# Patient Record
Sex: Male | Born: 1950 | Race: White | Hispanic: No | Marital: Married | Smoking: Current some day smoker
Health system: Southern US, Community
[De-identification: ages and names within clinical notes are randomized; demographics above are authoritative.]

## PROBLEM LIST (undated history)

## (undated) DIAGNOSIS — K2100 Gastro-esophageal reflux disease with esophagitis, without bleeding: Secondary | ICD-10-CM

## (undated) DIAGNOSIS — M4120 Other idiopathic scoliosis, site unspecified: Secondary | ICD-10-CM

## (undated) DIAGNOSIS — M1990 Unspecified osteoarthritis, unspecified site: Secondary | ICD-10-CM

## (undated) DIAGNOSIS — Z8719 Personal history of other diseases of the digestive system: Secondary | ICD-10-CM

## (undated) DIAGNOSIS — E119 Type 2 diabetes mellitus without complications: Secondary | ICD-10-CM

## (undated) DIAGNOSIS — K279 Peptic ulcer, site unspecified, unspecified as acute or chronic, without hemorrhage or perforation: Secondary | ICD-10-CM

## (undated) DIAGNOSIS — K219 Gastro-esophageal reflux disease without esophagitis: Secondary | ICD-10-CM

## (undated) HISTORY — PX: ROTATOR CUFF REPAIR: SHX139

## (undated) HISTORY — DX: Other idiopathic scoliosis, site unspecified: M41.20

## (undated) HISTORY — PX: CHOLECYSTECTOMY: SHX55

---

## 1985-10-10 HISTORY — PX: MENISCUS REPAIR: SHX5179

## 2014-05-10 HISTORY — PX: HERNIA REPAIR: SHX51

## 2017-02-22 ENCOUNTER — Other Ambulatory Visit: Payer: Self-pay | Admitting: Neurosurgery

## 2017-03-10 ENCOUNTER — Encounter: Payer: Self-pay | Admitting: *Deleted

## 2017-03-13 ENCOUNTER — Ambulatory Visit
Admission: RE | Admit: 2017-03-13 | Discharge: 2017-03-13 | Disposition: A | Discharge status: Home / Self Care | Payer: Medicare Other | Source: Ambulatory Visit | Attending: Unknown Physician Specialty | Admitting: Unknown Physician Specialty

## 2017-03-13 ENCOUNTER — Ambulatory Visit: Payer: Medicare Other | Admitting: *Deleted

## 2017-03-13 ENCOUNTER — Encounter
Admission: RE | Disposition: A | Discharge status: Home / Self Care | Payer: Self-pay | Source: Ambulatory Visit | Attending: Unknown Physician Specialty

## 2017-03-13 ENCOUNTER — Encounter: Payer: Self-pay | Admitting: *Deleted

## 2017-03-13 DIAGNOSIS — D120 Benign neoplasm of cecum: Secondary | ICD-10-CM | POA: Diagnosis not present

## 2017-03-13 DIAGNOSIS — K621 Rectal polyp: Secondary | ICD-10-CM | POA: Diagnosis not present

## 2017-03-13 DIAGNOSIS — E119 Type 2 diabetes mellitus without complications: Secondary | ICD-10-CM | POA: Diagnosis not present

## 2017-03-13 DIAGNOSIS — K210 Gastro-esophageal reflux disease with esophagitis: Secondary | ICD-10-CM | POA: Insufficient documentation

## 2017-03-13 DIAGNOSIS — Z7984 Long term (current) use of oral hypoglycemic drugs: Secondary | ICD-10-CM | POA: Insufficient documentation

## 2017-03-13 DIAGNOSIS — F17200 Nicotine dependence, unspecified, uncomplicated: Secondary | ICD-10-CM | POA: Diagnosis not present

## 2017-03-13 DIAGNOSIS — Z9049 Acquired absence of other specified parts of digestive tract: Secondary | ICD-10-CM | POA: Insufficient documentation

## 2017-03-13 DIAGNOSIS — Z1211 Encounter for screening for malignant neoplasm of colon: Secondary | ICD-10-CM | POA: Insufficient documentation

## 2017-03-13 DIAGNOSIS — Z86010 Personal history of colonic polyps: Secondary | ICD-10-CM | POA: Diagnosis not present

## 2017-03-13 DIAGNOSIS — Z79899 Other long term (current) drug therapy: Secondary | ICD-10-CM | POA: Insufficient documentation

## 2017-03-13 DIAGNOSIS — Z8711 Personal history of peptic ulcer disease: Secondary | ICD-10-CM | POA: Insufficient documentation

## 2017-03-13 HISTORY — DX: Gastro-esophageal reflux disease without esophagitis: K21.9

## 2017-03-13 HISTORY — DX: Gastro-esophageal reflux disease with esophagitis, without bleeding: K21.00

## 2017-03-13 HISTORY — DX: Gastro-esophageal reflux disease with esophagitis: K21.0

## 2017-03-13 HISTORY — DX: Peptic ulcer, site unspecified, unspecified as acute or chronic, without hemorrhage or perforation: K27.9

## 2017-03-13 HISTORY — PX: COLONOSCOPY WITH PROPOFOL: SHX5780

## 2017-03-13 HISTORY — DX: Type 2 diabetes mellitus without complications: E11.9

## 2017-03-13 SURGERY — COLONOSCOPY WITH PROPOFOL
Anesthesia: General

## 2017-03-13 MED ORDER — PROPOFOL 10 MG/ML IV BOLUS
INTRAVENOUS | Status: AC
  Filled 2017-03-13: qty 20

## 2017-03-13 MED ORDER — SODIUM CHLORIDE 0.9 % IV SOLN: INTRAVENOUS | Status: DC

## 2017-03-13 MED ORDER — PROPOFOL 10 MG/ML IV BOLUS
INTRAVENOUS | Status: DC | PRN
  Administered 2017-03-13: 350 mg via INTRAVENOUS

## 2017-03-13 MED ORDER — SODIUM CHLORIDE 0.9 % IV SOLN
INTRAVENOUS | Status: DC
  Administered 2017-03-13: 13:00:00 via INTRAVENOUS
  Administered 2017-03-13: 1000 mL via INTRAVENOUS

## 2017-03-13 MED ORDER — PROPOFOL 500 MG/50ML IV EMUL
INTRAVENOUS | Status: AC
  Filled 2017-03-13: qty 50

## 2017-03-13 NOTE — Anesthesia Postprocedure Evaluation (Signed)
Anesthesia Post Note  Patient: Gabriel Cummings  Procedure(s) Performed: Procedure(s) (LRB): COLONOSCOPY WITH PROPOFOL (N/A)  Patient location during evaluation: Endoscopy Anesthesia Type: General Level of consciousness: awake and alert Pain management: pain level controlled Vital Signs Assessment: post-procedure vital signs reviewed and stable Respiratory status: spontaneous breathing and respiratory function stable Cardiovascular status: stable Anesthetic complications: no     Last Vitals:  Vitals:   03/13/17 1414 03/13/17 1424  BP: (!) 133/93 (!) 136/92  Pulse: 73 69  Resp: 20 17  Temp:      Last Pain:  Vitals:   03/13/17 1404  TempSrc: Axillary                 KEPHART,WILLIAM K

## 2017-03-13 NOTE — Anesthesia Post-op Follow-up Note (Cosign Needed)
Anesthesia QCDR form completed.        

## 2017-03-13 NOTE — Op Note (Signed)
[facility] Patient Name: Gabriel Cummings Procedure Date: 03/13/2017 1:31 PM MRN: [id number] Account #: [id number] Date of Birth: [birth year] Admit Type: Outpatient Age: 66 Room: [facility] ENDO ROOM 1 Gender: Male Note Status: Finalized Procedure:            Colonoscopy Indications:          High risk colon cancer surveillance: Personal history                        of colonic polyps Providers:            Manya Silvas, MD Referring MD:         Renee Rival (Referring MD) Medicines:            Propofol per Anesthesia Complications:        No immediate complications. Procedure:            Pre-Anesthesia Assessment:                       - After reviewing the risks and benefits, the patient                        was deemed in satisfactory condition to undergo the                        procedure.                       After obtaining informed consent, the colonoscope was                        passed under direct vision. Throughout the procedure,                        the patient's blood pressure, pulse, and oxygen                        saturations were monitored continuously. The                        Colonoscope was introduced through the anus and                        advanced to the the cecum, identified by appendiceal                        orifice and ileocecal valve. The colonoscopy was                        performed without difficulty. The patient tolerated the                        procedure well. The quality of the bowel preparation                        was good. Findings:      A 10 mm linear polyp was found in the cecum. The polyp was sessile. The       polyp was removed in pieces with a Duck[place] and a regular hot snare and       a cold bipsy.Marland Kitchen Resection and retrieval were complete.  A diminutive polyp was found in the rectum. The polyp was sessile. The       polyp was removed with a jumbo cold forceps. Resection and  retrieval       were complete.      Internal hemorrhoids were found during endoscopy. The hemorrhoids were       medium-sized and Grade II (internal hemorrhoids that prolapse but reduce       spontaneously).      The exam was otherwise without abnormality. Impression:           - One 10 mm polyp in the cecum, removed with a hot                        snare. Resected and retrieved.                       - One diminutive polyp in the rectum, removed with a                        jumbo cold forceps. Resected and retrieved.                       - Internal hemorrhoids.                       - The examination was otherwise normal. Recommendation:       - Await pathology results. Manya Silvas, MD 03/13/2017 2:06:40 PM This report has been signed electronically. Number of Addenda: 0 Note Initiated On: 03/13/2017 1:31 PM Scope Withdrawal Time: 0 hours 19 minutes 25 seconds  Total Procedure Duration: 0 hours 23 minutes 56 seconds       [facility]

## 2017-03-13 NOTE — H&P (Signed)
   Primary Care Physician:  Renee Rival, NP Primary Gastroenterologist:  Dr. Vira Agar  Pre-Procedure History & Physical: HPI:  Gabriel Cummings is a 66 y.o. male is here for an colonoscopy.   Past Medical History:  Diagnosis Date  . Diabetes mellitus without complication ([place])   . GERD (gastroesophageal reflux disease)   . PUD (peptic ulcer disease)   . Reflux esophagitis     Past Surgical History:  Procedure Laterality Date  . CHOLECYSTECTOMY    . HERNIA REPAIR  05/2014  . ROTATOR CUFF REPAIR      Prior to Admission medications   Medication Sig Start Date End Date Taking? Authorizing Provider  fexofenadine (ALLEGRA) 180 MG tablet Take 180 mg by mouth daily.   Yes [provider]  gabapentin (NEURONTIN) 100 MG capsule Take 100 mg by mouth 2 (two) times daily. As needed   Yes [provider]  omeprazole (PRILOSEC) 20 MG capsule Take 20 mg by mouth daily.   Yes [provider]  albuterol (PROVENTIL HFA;VENTOLIN HFA) 108 (90 Base) MCG/ACT inhaler Inhale 2 puffs into the lungs every 6 (six) hours as needed for wheezing or shortness of breath.    [provider]  metFORMIN (GLUCOPHAGE) 500 MG tablet Take 500 mg by mouth daily with supper.    [provider]    Allergies as of 01/02/2017  . (Not on File)    History reviewed. No pertinent family history.  Social History   Social History  . Marital status: Married    Spouse name: N/A  . Number of children: N/A  . Years of education: N/A   Occupational History  . Not on file.   Social History Main Topics  . Smoking status: Current Some Day Smoker  . Smokeless tobacco: Former Systems developer  . Alcohol use Yes  . Drug use: No  . Sexual activity: Not on file   Other Topics Concern  . Not on file   Social History Narrative  . No narrative on file    Review of Systems: See HPI, otherwise negative ROS  Physical Exam: BP 133/86   Temp 97.1 F (36.2 C) (Tympanic)   Resp 18    Ht 5\' 11"  (1.803 m)   Wt 93 kg (205 lb)   SpO2 100%   BMI 28.59 kg/m  General:   Alert,  pleasant and cooperative in NAD Head:  Normocephalic and atraumatic. Neck:  Supple; no masses or thyromegaly. Lungs:  Clear throughout to auscultation.    Heart:  Regular rate and rhythm. Abdomen:  Soft, nontender and nondistended. Normal bowel sounds, without guarding, and without rebound.   Neurologic:  Alert and  oriented x4;  grossly normal neurologically.  Impression/Plan: Gabriel Cummings is here for an colonoscopy to be performed for personal history of colon polpyps  Risks, benefits, limitations, and alternatives regarding  colonoscopy have been reviewed with the patient.  Questions have been answered.  All parties agreeable.   Gaylyn Cheers, MD  03/13/2017, 1:31 PM

## 2017-03-13 NOTE — Anesthesia Preprocedure Evaluation (Signed)
Anesthesia Evaluation  Patient identified by MRN, date of birth, ID band Patient awake    Reviewed: Allergy & Precautions, NPO status , Patient's Chart, lab work & pertinent test results  History of Anesthesia Complications (+) PONV and history of anesthetic complications  Airway Mallampati: I       Dental  (+) Upper Dentures, Partial Lower   Pulmonary Current Smoker,           Cardiovascular negative cardio ROS       Neuro/Psych negative neurological ROS     GI/Hepatic Neg liver ROS, PUD, GERD  Medicated and Controlled,  Endo/Other  diabetes (borderline, no meds), Type 2  Renal/GU negative Renal ROS     Musculoskeletal   Abdominal   Peds  Hematology   Anesthesia Other Findings   Reproductive/Obstetrics                             Anesthesia Physical Anesthesia Plan  ASA: II  Anesthesia Plan: General   Post-op Pain Management:    Induction: Intravenous  Airway Management Planned: Nasal Cannula  Additional Equipment:   Intra-op Plan:   Post-operative Plan:   Informed Consent: I have reviewed the patients History and Physical, chart, labs and discussed the procedure including the risks, benefits and alternatives for the proposed anesthesia with the patient or authorized representative who has indicated his/her understanding and acceptance.     Plan Discussed with:   Anesthesia Plan Comments:         Anesthesia Quick Evaluation

## 2017-03-13 NOTE — Transfer of Care (Signed)
Immediate Anesthesia Transfer of Care Note  Patient: Gabriel Cummings  Procedure(s) Performed: Procedure(s): COLONOSCOPY WITH PROPOFOL (N/A)  Patient Location: PACU  Anesthesia Type:General  Level of Consciousness: awake, alert  and oriented  Airway & Oxygen Therapy: Patient Spontanous Breathing and Patient connected to face mask oxygen  Post-op Assessment: Report given to RN and Post -op Vital signs reviewed and stable  Post vital signs: Reviewed and stable  Last Vitals:  Vitals:   03/13/17 1259 03/13/17 1404  BP: 133/86 119/75  Resp: 18   Temp: 36.2 C (!) 36.1 C    Last Pain:  Vitals:   03/13/17 1404  TempSrc: Axillary         Complications: No apparent anesthesia complications

## 2017-03-14 ENCOUNTER — Encounter: Payer: Self-pay | Admitting: Unknown Physician Specialty

## 2017-03-15 LAB — SURGICAL PATHOLOGY

## 2017-04-03 ENCOUNTER — Other Ambulatory Visit: Payer: Self-pay

## 2017-04-18 ENCOUNTER — Encounter (HOSPITAL_COMMUNITY)
Admission: RE | Admit: 2017-04-18 | Discharge: 2017-04-18 | Disposition: A | Discharge status: Home / Self Care | Payer: Medicare Other | Source: Ambulatory Visit | Attending: Neurosurgery | Admitting: Neurosurgery

## 2017-04-18 ENCOUNTER — Telehealth: Payer: Self-pay | Admitting: Vascular Surgery

## 2017-04-18 NOTE — Telephone Encounter (Signed)
-----   Message from Denman George, RN sent at 04/03/2017  9:45 AM EDT ----- Regarding: needs office consult with Dr. Donnetta Hutching Please schedule a new patient consult with Dr. Donnetta Hutching prior to ALIF on 05/08/17.  Please remind the pt. to bring copy of L-S spine film to appt.

## 2017-04-18 NOTE — Telephone Encounter (Signed)
Sched appt 04/25/17 at 4:15 as per TFE. Lm on hm#.

## 2017-04-21 ENCOUNTER — Encounter: Payer: Self-pay | Admitting: Vascular Surgery

## 2017-04-25 ENCOUNTER — Encounter: Payer: Self-pay | Admitting: Vascular Surgery

## 2017-04-25 ENCOUNTER — Ambulatory Visit (INDEPENDENT_AMBULATORY_CARE_PROVIDER_SITE_OTHER): Payer: Medicare Other | Admitting: Vascular Surgery

## 2017-04-25 VITALS — BP 127/88 | HR 64 | Temp 97.3°F | Resp 18 | Ht 71.0 in | Wt 207.0 lb

## 2017-04-25 DIAGNOSIS — M5137 Other intervertebral disc degeneration, lumbosacral region: Secondary | ICD-10-CM

## 2017-04-25 NOTE — Progress Notes (Signed)
[facility] [place]  Patient name: Gabriel Cummings MRN: [id number] DOB: 10-15-1950 Sex: male  REASON FOR CONSULT: Discuss upcoming anterior exposure for L5-S1 fusion with Dr. Vertell Limber  HPI: Gabriel Cummings is a 66 y.o. male, who is here today for discussion of my role for anterior exposure for his upcoming spine surgery on 05/08/2017. 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 He has failed conservative treatment and is having difficulty with his daily activity. He reports that he has pain that radiates into both lower extremity is in addition to the pain that he has in his back. He reports that some days are more tolerable than others but he generally has a chronic pain. Preoperative workup suggested the need for surgical repair and he is been recommended to have part of his fusion and repair from the anterior approach. His only prior intra-abdominal surgery was cholecystectomy years ago with a  subcostal incision.  Past Medical History:  Diagnosis Date  . Diabetes mellitus without complication ([place])   . GERD (gastroesophageal reflux disease)   . PUD (peptic ulcer disease)   . Reflux esophagitis   . Scoliosis (and kyphoscoliosis), idiopathic     No family history on file.  SOCIAL HISTORY: Social History   Social History  . Marital status: Married    Spouse name: N/A  . Number of children: N/A  . Years of education: N/A   Occupational History  . Not on file.   Social History Main Topics  . Smoking status: Current Some Day Smoker  . Smokeless tobacco: Former Systems developer  . Alcohol use Yes  . Drug use: No  . Sexual activity: Not on file   Other Topics Concern  . Not on file   Social History Narrative  . No narrative on file    No Known Allergies  Current Outpatient Prescriptions  Medication Sig Dispense Refill  . gabapentin (NEURONTIN) 300 MG capsule Take 300 mg by mouth 3 (three) times daily as needed  (pain).    Marland Kitchen omeprazole (PRILOSEC) 20 MG capsule Take 20 mg by mouth daily.    . Naproxen Sodium (ALEVE) 220 MG CAPS Take by mouth.    . naproxen sodium (ALEVE) 220 MG tablet Take 220 mg by mouth 2 (two) times daily with a meal.     No current facility-administered medications for this visit.     REVIEW OF SYSTEMS:  [X]  denotes positive finding, [ ]  denotes negative finding Cardiac  Comments:  Chest pain or chest pressure:    Shortness of breath upon exertion:    Short of breath when lying flat:    Irregular heart rhythm:        Vascular    Pain in calf, thigh, or hip brought on by ambulation:    Pain in feet at night that wakes you up from your sleep:     Blood clot in your veins:    Leg swelling:         Pulmonary    Oxygen at home:    Productive cough:     Wheezing:         Neurologic    Sudden weakness in arms or legs:     Sudden numbness in arms or legs:     Sudden onset of difficulty speaking or slurred speech:    Temporary loss of vision in one eye:     Problems with dizziness:         Gastrointestinal  Blood in stool:     Vomited blood:         Genitourinary    Burning when urinating:     Blood in urine:        Psychiatric    Major depression:         Hematologic    Bleeding problems:    Problems with blood clotting too easily:        Skin    Rashes or ulcers:        Constitutional    Fever or chills:      PHYSICAL EXAM: Vitals:   04/25/17 1536  BP: 127/88  Pulse: 64  Resp: 18  Temp: (!) 97.3 F (36.3 C)  SpO2: 97%  Weight: 207 lb (93.9 kg)  Height: 5\' 11"  (1.803 m)    GENERAL: The patient is a well-nourished male, in no acute distress. The vital signs are documented above. CARDIOVASCULAR: Carotid arteries without bruits bilaterally. 2+ radial 2+ femoral and 2+ dorsalis pedis pulses PULMONARY: There is good air exchange  ABDOMEN: Soft and non-tender . Right subcostal incision MUSCULOSKELETAL: There are no major deformities or  cyanosis. NEUROLOGIC: No focal weakness or paresthesias are detected. SKIN: There are no ulcers or rashes noted. PSYCHIATRIC: The patient has a normal affect.  DATA:  Reviewed MRI report but no CT or plain films available.  MEDICAL ISSUES: I discussed my role for exposure. Explained location of his left lower quadrant transverse incision. Explain mobilization of the rectus muscle, intraperitoneal, contents, left ureter and arterial venous structures overlying the spine. Also explained the potential for injury to each of these. I do not feel feel that he is in any prohibitive risk since he has not had pelvic surgery, is not morbidly obese and has no evidence of peripheral arterial disease. Answered all questions and plan for surgery on 05/08/2017 as scheduled   Rosetta Posner, MD [facility] Tel [phone] Pager [phone]

## 2017-04-27 ENCOUNTER — Encounter: Payer: Self-pay | Admitting: Neurosurgery

## 2017-05-04 NOTE — H&P (Signed)
Patient ID:   [id number] Patient: Gabriel Cummings  Date of Birth: March 15, 1951 Visit Type: Office Visit   Date: 02/15/2017 09:45 AM Provider: Marchia Meiers. Vertell Limber MD   This 66 year old male presents for back pain.   History of Present Illness: 1.  back pain  Mr. Lad returns to discuss details of his surgery. His pain improved slightly since his last visit but today (02/13/2017) he reports that he is having a bad day in terms of pain. His left leg pain persists and radiates to his shin and ankle. His pain is mostly in left thigh and left lumbar region.           MEDICATIONS(added, continued or stopped this visit): Started Medication Directions Instruction Stopped   Aleve 220 mg tablet take 1 tablet by oral route  every 12 hours as needed    01/27/2017 gabapentin 300 mg capsule take 1 capsule by oral route 3 times every day     omeprazole 20 mg capsule,delayed release take 1 capsule by oral route  every day 30 minutes to 1 hour before a meal       ALLERGIES: Ingredient Reaction Medication Name Comment  NO KNOWN ALLERGIES     No known allergies.    Vitals Date Temp F BP Pulse Ht In Wt Lb BMI BSA Pain Score  02/15/2017  143/89 70 71 207.2 28.9  6/10      IMPRESSION MRI reveals spondylolisthesis at L5-S1. Nerve compression and pinched nerve at L3-4- I believe this level is causing much of his pain and discomfort.   We discussed patient's recommended surgical procedure: L3-4, L4-5 left XLIF, ALIF L5-S1. percutaneous screws L3-S1.   Assessment/Plan # Detail Type Description   1. Assessment Lumbar radiculopathy (M54.16).       2. Assessment Low back pain, unspecified back pain laterality, with sciatica presence unspecified (M54.5).       3. 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   Plan Orders Aspen Lo Sag Rigid Panel Quick.       4. 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           nurse education  given. scheduled L3-4, L4-5 left XLIF, ALIF L5-S1. percutaneous screws L3-S1. fit for LSO brace.   Orders: Diagnostic Procedures: Assessment Procedure  M54.16 Lumbar Spine- AP/Lat  Miscellaneous: Assessment   M48.062 Aspen Lo Sag Rigid Panel Quick             Provider:  Vertell Limber MD, Marchia Meiers 02/15/2017 11:12 AM  Dictation edited by: Dionne Bucy    CC Providers: Otho Darner  [address] [place] [address]-              Electronically signed by Marchia Meiers. Vertell Limber MD on 02/18/2017 04:08 PM  Patient ID:   [id number] Patient: Gabriel Cummings  Date of Birth: December 08, 1950 Visit Type: Office Visit   Date: 02/08/2017 11:15 AM Provider: Marchia Meiers. Vertell Limber MD   This 67 year old male presents for back pain.  History of Present Illness: 1.  back pain    Patient returns to review his MRI.   Mr. Muegge pain is a 6-7/10 day to day. His left leg and left heel have been giving him the most discomfort. His pain is intermittent but can be severe and debilitating on some days. The pain is interfering with his quality of life and he reports some depression. He reports decreased endurance and struggles to walk 0.5 miles. He reports no relief from  PT.        MEDICATIONS(added, continued or stopped this visit): Started Medication Directions Instruction Stopped   Aleve 220 mg tablet take 1 tablet by oral route  every 12 hours as needed    01/27/2017 gabapentin 300 mg capsule take 1 capsule by oral route 3 times every day     omeprazole 20 mg capsule,delayed release take 1 capsule by oral route  every day 30 minutes to 1 hour before a meal       ALLERGIES: Ingredient Reaction Medication Name Comment  NO KNOWN ALLERGIES     No known allergies. Reviewed, no changes.    Vitals Date Temp F BP Pulse Ht In Wt Lb BMI BSA Pain Score  02/08/2017  129/77 78 71 206 28.73  7/10      IMPRESSION MRI reveals spondylolisthesis. L1-2 normal. L2-3 mild narrowing. L3-4 increased  narrowing. L4-5 narrowing. L5-S1 narrowing worse on right   x-rays reveal arthritis, degeneration and spondylolisthesis. retroverted pelvis. stenosis and mild scoliosis.   I believe the patient requires an operation (probable XLIF/ALIF) however he will follow up for a return visit in 2-3 weeks and we will discuss surgical possibilities in more detail.      Completed Orders (this encounter) Order Details Reason Side Interpretation Result Initial Treatment Date Region  Scoliosis- AP/Lat      02/08/2017 All Levels to All Levels   Assessment/Plan # Detail Type Description   1. 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         Pain Assessment/Treatment Location: back. Onset: 01/09/2014. Duration: varies. Quality: discomforting. Pain Assessment/Treatment follow-up plan of care: Patient is taking medications as prescribed..  probable XLIF ALIF. follow up to go over surgical plan in 2-3 weeks.   Orders: Diagnostic Procedures: Assessment Procedure  M41.20 Scoliosis- AP/Lat             Provider:  Marchia Meiers. Vertell Limber MD  02/08/2017 01:34 PM Dictation edited by: Dionne Bucy    CC Providers: Otho Darner [address] [place] [address]-              Electronically signed by Marchia Meiers. Vertell Limber MD on 02/09/2017 05:20 PM  Patient ID:   [id number] Patient: Gabriel Cummings  Date of Birth: 05-15-1951 Visit Type: Office Visit   Date: 01/09/2017 09:15 AM Provider: Marchia Meiers. Vertell Limber MD   This 66 year old male presents for back pain.  History of Present Illness: 1.  back pain    Mozes Sagar, 67 year old retired male, visits for evaluation of low back and bilateral leg pain.  Patient reports 2 year history of low back and right leg pain, with increased symptoms and new left leg pain since gym workouts 3 weeks ago.  Today, his pain is a 6-7/10 and a 10/10 at its worst.   Physical therapy offered little relief ESI 's x2 years, most  recent 6 weeks ago offered no relief  Gabapentin 300 mg 1-3/day Aleve daily  History:  Ulcer, GERD Surgical history:  Right shoulder 15 years ago, cholecystectomy 1978, ulcer 15 years ago  2016 lumbar MRI on [place].  X-rays on [facility]        PAST MEDICAL/SURGICAL HISTORY   (Detailed)     Family History  (Detailed)   SOCIAL HISTORY  (Detailed) Preferred language is Unknown.   HOME ENVIRONMENT/SAFETY  The Patient has fallen 1 times in the last year.  The fall(s) resulted in injury.  Details: bruises.       MEDICATIONS(added,  continued or stopped this visit):   ALLERGIES:   REVIEW OF SYSTEMS System Neg/Pos Details  Constitutional Negative Chills, fatigue, fever, malaise, night sweats, weight gain and weight loss.  ENMT Negative Ear drainage, hearing loss, nasal drainage, otalgia, sinus pressure and sore throat.  Eyes Negative Eye discharge, eye pain and vision changes.  Respiratory Negative Chronic cough, cough, dyspnea, known TB exposure and wheezing.  Cardio Negative Chest pain, claudication, edema and irregular heartbeat/palpitations.  GI Negative Abdominal pain, blood in stool, change in stool pattern, constipation, decreased appetite, diarrhea, heartburn, nausea and vomiting.  GU Negative Dribbling, dysuria, erectile dysfunction, hematuria, polyuria, slow stream, urinary frequency, urinary incontinence and urinary retention.  Endocrine Negative Cold intolerance, heat intolerance, polydipsia and polyphagia.  Neuro Negative Dizziness, extremity weakness, gait disturbance, headache, memory impairment, numbness in extremity, seizures and tremors.  Psych Negative Anxiety, depression and insomnia.  Integumentary Negative Brittle hair, brittle nails, change in shape/size of mole(s), hair loss, hirsutism, hives, pruritus, rash and skin lesion.  MS Positive Back pain.  Hema/Lymph Negative Easy bleeding, easy bruising and lymphadenopathy.  Allergic/Immuno Negative Contact  allergy, environmental allergies, food allergies and seasonal allergies.  Reproductive Negative Penile discharge and sexual dysfunction.     Vitals Date Temp F BP Pulse Ht In Wt Lb BMI BSA Pain Score  01/09/2017  157/92 72 71 207.8 28.98  7/10     PHYSICAL EXAM General Level of Distress: no acute distress Overall Appearance: normal  Head and Face  Right Left  Fundoscopic Exam:  normal normal    Cardiovascular Cardiac: regular rate and rhythm without murmur  Right Left  Carotid Pulses: normal normal  Respiratory Lungs: clear to auscultation  Neurological Orientation: normal Recent and Remote Memory: normal Attention Span and Concentration:   normal Language: normal Fund of Knowledge: normal  Right Left Sensation: normal normal Upper Extremity Coordination: normal normal  Lower Extremity Coordination: normal normal  Musculoskeletal Gait and Station: normal  Right Left Upper Extremity Muscle Strength: normal normal Lower Extremity Muscle Strength: normal normal Upper Extremity Muscle Tone:  normal normal Lower Extremity Muscle Tone: normal normal   Motor Strength Upper and lower extremity motor strength was tested in the clinically pertinent muscles.     Deep Tendon Reflexes  Right Left Biceps: normal normal Triceps: normal normal Brachioradialis: normal normal Patellar: normal normal Achilles: normal normal  Sensory Sensation was tested at L1 to S1.   Cranial Nerves II. Optic Nerve/Visual Fields: normal III. Oculomotor: normal IV. Trochlear: normal V. Trigeminal: normal VI. Abducens: normal VII. Facial: normal VIII. Acoustic/Vestibular: normal IX. Glossopharyngeal: normal X. Vagus: normal XI. Spinal Accessory: normal XII. Hypoglossal: normal  Motor and other Tests Lhermittes: negative Rhomberg: negative Pronator drift: absent     Right Left Hoffman's: normal normal Clonus: normal normal Babinski: normal normal SLR: positive at 45  degrees positive at 45 degrees Patrick's Corky Sox): negative negative Toe Walk: normal normal Toe Lift: normal normal Heel Walk: normal normal SI Joint: nontender nontender   Additional Findings:  Sciatic notch discomfort bilaterally and pain at lumbosacral junction, 8 inch toe touch, able to stand on toes and heels, EOMM intact, fundoscopic exam reveals no areas of concern, UE & LE strength is full, diminished but symmetric reflexes throughout, mid-calf stocking distribution numbness bilaterally, positive SLR at 40-45 degrees bilaterally.   DIAGNOSTIC RESULTS 10/16/14 MRI:  Multilevel degenerative changes with most significant neuroforaminal narrowing moderate right at L5-S1.  Additional scattered neuroforamina as described.  Tiny right paracentral disc protrusion and extrusion at L2-L3.  No significant compression of the exiting nerve root is suspected at this level however.     IMPRESSION The patient comes in for evaluation of low back and bilateral LE pain, left>right.  His pain travels to his feet bilaterally and into his middle toes.  Recently, his pain has been increasing in severity.  He reports numbness in his LE bilaterally and diabetes.  His blood glucose levels have been 120-150 mg/dL recently with 6.9 a1c.  He is taking gabapentin to treat his symptoms.  His MRI reveals disc protrusion right>left at L2-3, stenosis at L3-4, and spondylolisthesis of L5-S1 with facet joint arthritis on the right at this level.  X-rays reveal lumbar dextroconvex scoliosis and anterolisthesis of L5-S1 with neutral: 9 mm, flexion: 10 mm, extension: 5.5 mm.  On confrontational testing, he has sciatic notch discomfort bilaterally and pain at lumbosacral junction, 8 inch toe touch,able to stand on toes and heels, EOMM intact, fundoscopic exam reveals no areas of concern, UE & LE strength is full, diminished but symmetric reflexes throughout, mid-calf stocking distribution numbness bilaterally, positive SLR at  40-45 degrees bilaterally.  I believe his diabetes may be contributing to some of his symptoms and I would like to order a lumbar MRI to further evaluate his condition.  Follow up with me after to discuss.  Completed Orders (this encounter) Order Details Reason Side Interpretation Result Initial Treatment Date Region  Lumbar Spine- AP/Lat/Obls/Spot/Flex/Ex      01/09/2017 All Levels to All Levels   Assessment/Plan # Detail Type Description   1. Assessment Lumbar radiculopathy (M54.16).         Pain Assessment/Treatment Pain Scale: 7/10. Method: Numeric Pain Intensity Scale. Location: back. Duration: varies. Quality: discomforting. Pain Assessment/Treatment follow-up plan of care: Patient uses inversion machine and stretches..  Fall Risk Plan The Patient has fallen 1 times in the last year. The fall(s) resulted in injury. Details: bruises. Falls risk follow-up plan of care: Assisted devices: Advise to use safety measures when available.  Ordered lumbar MRI.  Follow up with me after to discuss.  Orders: Diagnostic Procedures: Assessment Procedure  M54.16 Lumbar Spine- AP/Lat/Obls/Spot/Flex/Ex             Provider:  Marchia Meiers. Vertell Limber MD  01/09/2017 12:07 PM Dictation edited by: Daine Gravel    CC Providers: Otho Darner [address] [address]-              Electronically signed by Marchia Meiers. Vertell Limber MD on 01/09/2017 01:10 PM

## 2017-05-04 NOTE — Pre-Procedure Instructions (Signed)
Gabriel Cummings  05/04/2017      [place], [place] - [address] [place] [address] Phone: [phone] Fax: [phone]    Your procedure is scheduled on July 30  Report to [facility] at [facility].M.  Call this number if you have problems the morning of surgery:  [phone]   Remember:  Do not eat food or drink liquids after midnight.   Take these medicines the morning of surgery with A SIP OF WATER gabapentin (NEURONTIN), omeprazole (PRILOSEC)  7 days prior to surgery STOP taking any Aspirin, Aleve, Naproxen, Ibuprofen, Motrin, Advil, Goody's, BC's, all herbal medications, fish oil, and all vitamins    Do not wear jewelry  Do not wear lotions, powders, or cologne, or deoderant.  Men may shave face and neck.  Do not bring valuables to the hospital.  [facility] is not responsible for any belongings or valuables.  Contacts, dentures or bridgework may not be worn into surgery.  Leave your suitcase in the car.  After surgery it may be brought to your room.  For patients admitted to the hospital, discharge time will be determined by your treatment team.  Patients discharged the day of surgery will not be allowed to drive home.    Special instructions:   - Preparing For Surgery  Before surgery, you can play an important role. Because skin is not sterile, your skin needs to be as free of germs as possible. You can reduce the number of germs on your skin by washing with CHG (chlorahexidine gluconate) Soap before surgery.  CHG is an antiseptic cleaner which kills germs and bonds with the skin to continue killing germs even after washing.  Please do not use if you have an allergy to CHG or antibacterial soaps. If your skin becomes reddened/irritated stop using the CHG.  Do not shave (including legs and underarms) for at least 48 hours prior to first CHG shower. It is OK to shave your  face.  Please follow these instructions carefully.   1. Shower the NIGHT BEFORE SURGERY and the MORNING OF SURGERY with CHG.   2. If you chose to wash your hair, wash your hair first as usual with your normal shampoo.  3. After you shampoo, rinse your hair and body thoroughly to remove the shampoo.  4. Use CHG as you would any other liquid soap. You can apply CHG directly to the skin and wash gently with a scrungie or a clean washcloth.   5. Apply the CHG Soap to your body ONLY FROM THE NECK DOWN.  Do not use on open wounds or open sores. Avoid contact with your eyes, ears, mouth and genitals (private parts). Wash genitals (private parts) with your normal soap.  6. Wash thoroughly, paying special attention to the area where your surgery will be performed.  7. Thoroughly rinse your body with warm water from the neck down.  8. DO NOT shower/wash with your normal soap after using and rinsing off the CHG Soap.  9. Pat yourself dry with a CLEAN TOWEL.   10. Wear CLEAN PAJAMAS   11. Place CLEAN SHEETS on your bed the night of your first shower and DO NOT SLEEP WITH PETS.    Day of Surgery: Do not apply any deodorants/lotions. Please wear clean clothes to the hospital/surgery center.      Please read over the following fact sheets that you were given.

## 2017-05-05 ENCOUNTER — Encounter (HOSPITAL_COMMUNITY): Payer: Self-pay

## 2017-05-05 ENCOUNTER — Encounter (HOSPITAL_COMMUNITY)
Admission: RE | Admit: 2017-05-05 | Discharge: 2017-05-05 | Disposition: A | Discharge status: Home / Self Care | Payer: Medicare Other | Source: Ambulatory Visit | Attending: Neurosurgery | Admitting: Neurosurgery

## 2017-05-05 DIAGNOSIS — Z0183 Encounter for blood typing: Secondary | ICD-10-CM | POA: Insufficient documentation

## 2017-05-05 DIAGNOSIS — Z01812 Encounter for preprocedural laboratory examination: Secondary | ICD-10-CM

## 2017-05-05 DIAGNOSIS — R001 Bradycardia, unspecified: Secondary | ICD-10-CM

## 2017-05-05 DIAGNOSIS — M48062 Spinal stenosis, lumbar region with neurogenic claudication: Secondary | ICD-10-CM | POA: Insufficient documentation

## 2017-05-05 DIAGNOSIS — Z01818 Encounter for other preprocedural examination: Secondary | ICD-10-CM

## 2017-05-05 DIAGNOSIS — E119 Type 2 diabetes mellitus without complications: Secondary | ICD-10-CM | POA: Insufficient documentation

## 2017-05-05 HISTORY — DX: Personal history of other diseases of the digestive system: Z87.19

## 2017-05-05 HISTORY — DX: Unspecified osteoarthritis, unspecified site: M19.90

## 2017-05-05 LAB — CBC
HEMATOCRIT: 47.8 % (ref 39.0–52.0)
HEMOGLOBIN: 16.3 g/dL (ref 13.0–17.0)
MCH: 31.1 pg (ref 26.0–34.0)
MCHC: 34.1 g/dL (ref 30.0–36.0)
MCV: 91.2 fL (ref 78.0–100.0)
Platelets: 221 10*3/uL (ref 150–400)
RBC: 5.24 MIL/uL (ref 4.22–5.81)
RDW: 12.8 % (ref 11.5–15.5)
WBC: 9.4 10*3/uL (ref 4.0–10.5)

## 2017-05-05 LAB — BASIC METABOLIC PANEL
ANION GAP: 9 (ref 5–15)
BUN: 17 mg/dL (ref 6–20)
CALCIUM: 9.6 mg/dL (ref 8.9–10.3)
CO2: 28 mmol/L (ref 22–32)
Chloride: 100 mmol/L — ABNORMAL LOW (ref 101–111)
Creatinine, Ser: 0.93 mg/dL (ref 0.61–1.24)
GFR calc non Af Amer: >60 mL/min (ref >60)
Glucose, Bld: 128 mg/dL — ABNORMAL HIGH (ref 65–99)
Potassium: 4.2 mmol/L (ref 3.5–5.1)
Sodium: 137 mmol/L (ref 135–145)

## 2017-05-05 LAB — SURGICAL PCR SCREEN
MRSA, PCR: NEGATIVE
Staphylococcus aureus: NEGATIVE

## 2017-05-05 LAB — TYPE AND SCREEN
ABO/RH(D): A POS
ANTIBODY SCREEN: NEGATIVE

## 2017-05-05 LAB — ABO/RH: ABO/RH(D): A POS

## 2017-05-05 MED ORDER — CEFAZOLIN SODIUM-DEXTROSE 2-4 GM/100ML-% IV SOLN
2.0000 g | INTRAVENOUS | Status: AC
  Administered 2017-05-08 (×2): 2 g via INTRAVENOUS
  Filled 2017-05-05: qty 100

## 2017-05-05 NOTE — Progress Notes (Signed)
PCP - Hollice Gong, PA Cardiologist - patient denies  Chest x-ray - n/a EKG - 05/05/2017 Stress Test - patient states it was 20+ years ago ECHO - patient denies Cardiac Cath - patient denies  Sleep Study - patient denies   Fasting Blood Sugar - 110-120s Checks Blood Sugar 1 time a day     Patient denies shortness of breath, fever, cough and chest pain at PAT appointment   Patient verbalized understanding of instructions that were given to them at the PAT appointment. Patient was also instructed that they will need to review over the PAT instructions again at home before surgery.

## 2017-05-06 LAB — HEMOGLOBIN A1C
HEMOGLOBIN A1C: 6.6 % — AB (ref 4.8–5.6)
Mean Plasma Glucose: 143 mg/dL

## 2017-05-07 NOTE — Anesthesia Preprocedure Evaluation (Addendum)
Anesthesia Evaluation  Patient identified by MRN, date of birth, ID band Patient awake    Reviewed: Allergy & Precautions, NPO status , Patient's Chart, lab work & pertinent test results  Airway Mallampati: II  TM Distance: >3 FB Neck ROM: Full    Dental  (+) Dental Advisory Given   Pulmonary Current Smoker,    breath sounds clear to auscultation       Cardiovascular negative cardio ROS   Rhythm:Regular Rate:Normal     Neuro/Psych negative neurological ROS     GI/Hepatic Neg liver ROS, hiatal hernia, PUD, GERD  ,  Endo/Other  diabetes  Renal/GU negative Renal ROS     Musculoskeletal  (+) Arthritis ,   Abdominal   Peds  Hematology negative hematology ROS (+)   Anesthesia Other Findings   Reproductive/Obstetrics                            Lab Results  Component Value Date   WBC 9.4 05/05/2017   HGB 16.3 05/05/2017   HCT 47.8 05/05/2017   MCV 91.2 05/05/2017   PLT 221 05/05/2017   Lab Results  Component Value Date   CREATININE 0.93 05/05/2017   BUN 17 05/05/2017   NA 137 05/05/2017   K 4.2 05/05/2017   CL 100 (L) 05/05/2017   CO2 28 05/05/2017    Anesthesia Physical Anesthesia Plan  ASA: II  Anesthesia Plan: General   Post-op Pain Management:    Induction: Intravenous  PONV Risk Score and Plan: 3 and Ondansetron, Dexamethasone, Midazolam and Treatment may vary due to age or medical condition  Airway Management Planned: Oral ETT  Additional Equipment: Arterial line  Intra-op Plan:   Post-operative Plan: Extubation in OR  Informed Consent: I have reviewed the patients History and Physical, chart, labs and discussed the procedure including the risks, benefits and alternatives for the proposed anesthesia with the patient or authorized representative who has indicated his/her understanding and acceptance.   Dental advisory given  Plan Discussed with:  CRNA  Anesthesia Plan Comments:        Anesthesia Quick Evaluation

## 2017-05-08 ENCOUNTER — Inpatient Hospital Stay (HOSPITAL_COMMUNITY): Payer: Medicare Other | Admitting: Anesthesiology

## 2017-05-08 ENCOUNTER — Inpatient Hospital Stay (HOSPITAL_COMMUNITY)
Admission: RE | Admit: 2017-05-08 | Discharge: 2017-05-09 | DRG: 460 | Disposition: A | Discharge status: Home / Self Care | Payer: Medicare Other | Attending: Neurosurgery | Admitting: Neurosurgery

## 2017-05-08 ENCOUNTER — Inpatient Hospital Stay (HOSPITAL_COMMUNITY): Payer: Medicare Other

## 2017-05-08 ENCOUNTER — Inpatient Hospital Stay (HOSPITAL_COMMUNITY)
Admission: RE | Disposition: A | Discharge status: Home / Self Care | Payer: Self-pay | Source: Home / Self Care | Attending: Neurosurgery

## 2017-05-08 ENCOUNTER — Encounter (HOSPITAL_COMMUNITY): Payer: Self-pay | Admitting: *Deleted

## 2017-05-08 DIAGNOSIS — M4126 Other idiopathic scoliosis, lumbar region: Secondary | ICD-10-CM | POA: Diagnosis present

## 2017-05-08 DIAGNOSIS — M5117 Intervertebral disc disorders with radiculopathy, lumbosacral region: Secondary | ICD-10-CM | POA: Diagnosis present

## 2017-05-08 DIAGNOSIS — M4690 Unspecified inflammatory spondylopathy, site unspecified: Secondary | ICD-10-CM | POA: Diagnosis present

## 2017-05-08 DIAGNOSIS — G588 Other specified mononeuropathies: Secondary | ICD-10-CM | POA: Diagnosis present

## 2017-05-08 DIAGNOSIS — M4316 Spondylolisthesis, lumbar region: Secondary | ICD-10-CM | POA: Diagnosis present

## 2017-05-08 DIAGNOSIS — M4317 Spondylolisthesis, lumbosacral region: Secondary | ICD-10-CM | POA: Diagnosis present

## 2017-05-08 DIAGNOSIS — Z79899 Other long term (current) drug therapy: Secondary | ICD-10-CM

## 2017-05-08 DIAGNOSIS — M48062 Spinal stenosis, lumbar region with neurogenic claudication: Secondary | ICD-10-CM | POA: Diagnosis not present

## 2017-05-08 DIAGNOSIS — Z419 Encounter for procedure for purposes other than remedying health state, unspecified: Secondary | ICD-10-CM

## 2017-05-08 DIAGNOSIS — E1141 Type 2 diabetes mellitus with diabetic mononeuropathy: Secondary | ICD-10-CM | POA: Diagnosis present

## 2017-05-08 DIAGNOSIS — K219 Gastro-esophageal reflux disease without esophagitis: Secondary | ICD-10-CM | POA: Diagnosis present

## 2017-05-08 DIAGNOSIS — M5416 Radiculopathy, lumbar region: Secondary | ICD-10-CM

## 2017-05-08 DIAGNOSIS — M545 Low back pain: Secondary | ICD-10-CM | POA: Diagnosis present

## 2017-05-08 DIAGNOSIS — Z9049 Acquired absence of other specified parts of digestive tract: Secondary | ICD-10-CM

## 2017-05-08 DIAGNOSIS — M419 Scoliosis, unspecified: Secondary | ICD-10-CM | POA: Diagnosis present

## 2017-05-08 DIAGNOSIS — M5116 Intervertebral disc disorders with radiculopathy, lumbar region: Secondary | ICD-10-CM | POA: Diagnosis present

## 2017-05-08 HISTORY — PX: ABDOMINAL EXPOSURE: SHX5708

## 2017-05-08 HISTORY — PX: ANTERIOR LUMBAR FUSION: SHX1170

## 2017-05-08 HISTORY — PX: ANTERIOR LAT LUMBAR FUSION: SHX1168

## 2017-05-08 HISTORY — PX: LUMBAR PERCUTANEOUS PEDICLE SCREW 3 LEVEL: SHX5562

## 2017-05-08 LAB — GLUCOSE, CAPILLARY
GLUCOSE-CAPILLARY: 131 mg/dL — AB (ref 65–99)
Glucose-Capillary: 171 mg/dL — ABNORMAL HIGH (ref 65–99)
Glucose-Capillary: 163 mg/dL — ABNORMAL HIGH (ref 65–99)

## 2017-05-08 SURGERY — ANTERIOR LUMBAR FUSION 1 LEVEL
Anesthesia: General

## 2017-05-08 MED ORDER — ACETAMINOPHEN 650 MG RE SUPP
650.0000 mg | RECTAL | Status: DC | PRN
Start: 2017-05-08 — End: 2017-05-09

## 2017-05-08 MED ORDER — MIDAZOLAM HCL 2 MG/2ML IJ SOLN
INTRAMUSCULAR | Status: AC
  Filled 2017-05-08: qty 2

## 2017-05-08 MED ORDER — FENTANYL CITRATE (PF) 250 MCG/5ML IJ SOLN
INTRAMUSCULAR | Status: AC
  Filled 2017-05-08: qty 5

## 2017-05-08 MED ORDER — KCL IN DEXTROSE-NACL 20-5-0.45 MEQ/L-%-% IV SOLN: INTRAVENOUS | Status: DC

## 2017-05-08 MED ORDER — OXYCODONE HCL 5 MG PO TABS
5.0000 mg | ORAL_TABLET | ORAL | Status: DC | PRN
  Administered 2017-05-08: 5 mg via ORAL
  Administered 2017-05-08 – 2017-05-09 (×4): 10 mg via ORAL
  Filled 2017-05-08 (×6): qty 2

## 2017-05-08 MED ORDER — GABAPENTIN 300 MG PO CAPS: 300.0000 mg | ORAL_CAPSULE | Freq: Three times a day (TID) | ORAL | Status: DC | PRN

## 2017-05-08 MED ORDER — LIDOCAINE-EPINEPHRINE 1 %-1:100000 IJ SOLN
INTRAMUSCULAR | Status: AC
Start: 2017-05-08 — End: 2017-05-08
  Filled 2017-05-08: qty 1

## 2017-05-08 MED ORDER — CHLORHEXIDINE GLUCONATE 4 % EX LIQD: 60.0000 mL | Freq: Once | CUTANEOUS | Status: DC

## 2017-05-08 MED ORDER — MENTHOL 3 MG MT LOZG: 1.0000 | LOZENGE | OROMUCOSAL | Status: DC | PRN

## 2017-05-08 MED ORDER — BUPIVACAINE LIPOSOME 1.3 % IJ SUSP
INTRAMUSCULAR | Status: DC | PRN
  Administered 2017-05-08: 20 mL

## 2017-05-08 MED ORDER — THROMBIN 20000 UNITS EX SOLR
CUTANEOUS | Status: AC
  Filled 2017-05-08: qty 20000

## 2017-05-08 MED ORDER — BISACODYL 10 MG RE SUPP
10.0000 mg | Freq: Every day | RECTAL | Status: DC | PRN
Start: 2017-05-08 — End: 2017-05-09
  Administered 2017-05-09: 10 mg via RECTAL
  Filled 2017-05-08: qty 1

## 2017-05-08 MED ORDER — HYDROMORPHONE HCL 1 MG/ML IJ SOLN
0.2500 mg | INTRAMUSCULAR | Status: DC | PRN
  Administered 2017-05-08 (×4): 0.5 mg via INTRAVENOUS

## 2017-05-08 MED ORDER — SODIUM CHLORIDE 0.9% FLUSH
3.0000 mL | Freq: Two times a day (BID) | INTRAVENOUS | Status: DC
  Administered 2017-05-09: 3 mL via INTRAVENOUS

## 2017-05-08 MED ORDER — ACETAMINOPHEN 325 MG PO TABS
650.0000 mg | ORAL_TABLET | ORAL | Status: DC | PRN
  Administered 2017-05-09: 650 mg via ORAL
  Filled 2017-05-08: qty 2

## 2017-05-08 MED ORDER — PANTOPRAZOLE SODIUM 40 MG PO TBEC
40.0000 mg | DELAYED_RELEASE_TABLET | Freq: Every day | ORAL | Status: DC
  Administered 2017-05-09: 40 mg via ORAL
  Filled 2017-05-08: qty 1

## 2017-05-08 MED ORDER — ONDANSETRON HCL 4 MG/2ML IJ SOLN
INTRAMUSCULAR | Status: DC | PRN
  Administered 2017-05-08: 4 mg via INTRAVENOUS

## 2017-05-08 MED ORDER — HYDROMORPHONE HCL 1 MG/ML IJ SOLN
INTRAMUSCULAR | Status: AC
  Filled 2017-05-08: qty 1

## 2017-05-08 MED ORDER — THROMBIN 5000 UNITS EX SOLR
CUTANEOUS | Status: AC
  Filled 2017-05-08: qty 10000

## 2017-05-08 MED ORDER — CHLORHEXIDINE GLUCONATE CLOTH 2 % EX PADS: 6.0000 | MEDICATED_PAD | Freq: Once | CUTANEOUS | Status: DC

## 2017-05-08 MED ORDER — THROMBIN 20000 UNITS EX SOLR
CUTANEOUS | Status: DC | PRN
  Administered 2017-05-08: 20000 [IU] via TOPICAL

## 2017-05-08 MED ORDER — SODIUM CHLORIDE 0.9 % IV SOLN
INTRAVENOUS | Status: DC | PRN
  Administered 2017-05-08: 13:00:00 via INTRAVENOUS

## 2017-05-08 MED ORDER — BUPIVACAINE LIPOSOME 1.3 % IJ SUSP
20.0000 mL | Freq: Once | INTRAMUSCULAR | Status: DC
  Filled 2017-05-08: qty 20

## 2017-05-08 MED ORDER — SENNOSIDES-DOCUSATE SODIUM 8.6-50 MG PO TABS
1.0000 | ORAL_TABLET | Freq: Every evening | ORAL | Status: DC | PRN
  Administered 2017-05-09: 1 via ORAL
  Filled 2017-05-08: qty 1

## 2017-05-08 MED ORDER — ROCURONIUM BROMIDE 100 MG/10ML IV SOLN
INTRAVENOUS | Status: DC | PRN
  Administered 2017-05-08: 50 mg via INTRAVENOUS
  Administered 2017-05-08: 20 mg via INTRAVENOUS

## 2017-05-08 MED ORDER — FENTANYL CITRATE (PF) 100 MCG/2ML IJ SOLN
INTRAMUSCULAR | Status: DC | PRN
  Administered 2017-05-08 (×15): 50 ug via INTRAVENOUS

## 2017-05-08 MED ORDER — ZOLPIDEM TARTRATE 5 MG PO TABS: 5.0000 mg | ORAL_TABLET | Freq: Every evening | ORAL | Status: DC | PRN

## 2017-05-08 MED ORDER — LIDOCAINE-EPINEPHRINE 1 %-1:100000 IJ SOLN
INTRAMUSCULAR | Status: DC | PRN
  Administered 2017-05-08: 5 mL

## 2017-05-08 MED ORDER — ONDANSETRON HCL 4 MG PO TABS: 4.0000 mg | ORAL_TABLET | Freq: Four times a day (QID) | ORAL | Status: DC | PRN

## 2017-05-08 MED ORDER — ROCURONIUM BROMIDE 10 MG/ML (PF) SYRINGE
PREFILLED_SYRINGE | INTRAVENOUS | Status: AC
  Filled 2017-05-08: qty 5

## 2017-05-08 MED ORDER — PROPOFOL 500 MG/50ML IV EMUL
INTRAVENOUS | Status: DC | PRN
  Administered 2017-05-08: 50 ug/kg/min via INTRAVENOUS

## 2017-05-08 MED ORDER — DEXAMETHASONE SODIUM PHOSPHATE 10 MG/ML IJ SOLN
INTRAMUSCULAR | Status: DC | PRN
  Administered 2017-05-08: 10 mg via INTRAVENOUS

## 2017-05-08 MED ORDER — MIDAZOLAM HCL 5 MG/5ML IJ SOLN
INTRAMUSCULAR | Status: DC | PRN
  Administered 2017-05-08: 2 mg via INTRAVENOUS

## 2017-05-08 MED ORDER — CEFAZOLIN SODIUM-DEXTROSE 2-4 GM/100ML-% IV SOLN
2.0000 g | Freq: Three times a day (TID) | INTRAVENOUS | Status: AC
  Administered 2017-05-08 – 2017-05-09 (×2): 2 g via INTRAVENOUS
  Filled 2017-05-08 (×2): qty 100

## 2017-05-08 MED ORDER — BUPIVACAINE HCL (PF) 0.5 % IJ SOLN
INTRAMUSCULAR | Status: DC | PRN
  Administered 2017-05-08: 5 mL

## 2017-05-08 MED ORDER — PROMETHAZINE HCL 25 MG/ML IJ SOLN: 6.2500 mg | INTRAMUSCULAR | Status: DC | PRN

## 2017-05-08 MED ORDER — LACTATED RINGERS IV SOLN
INTRAVENOUS | Status: DC | PRN
  Administered 2017-05-08 (×2): via INTRAVENOUS

## 2017-05-08 MED ORDER — SODIUM CHLORIDE 0.9% FLUSH: 3.0000 mL | INTRAVENOUS | Status: DC | PRN

## 2017-05-08 MED ORDER — DEXAMETHASONE SODIUM PHOSPHATE 10 MG/ML IJ SOLN
INTRAMUSCULAR | Status: AC
  Filled 2017-05-08: qty 1

## 2017-05-08 MED ORDER — CEFAZOLIN SODIUM 1 G IJ SOLR
INTRAMUSCULAR | Status: AC
  Filled 2017-05-08: qty 20

## 2017-05-08 MED ORDER — DIAZEPAM 5 MG PO TABS
5.0000 mg | ORAL_TABLET | Freq: Four times a day (QID) | ORAL | Status: DC | PRN
  Administered 2017-05-08 – 2017-05-09 (×4): 5 mg via ORAL
  Filled 2017-05-08 (×4): qty 1

## 2017-05-08 MED ORDER — SUCCINYLCHOLINE 20MG/ML (10ML) SYRINGE FOR MEDFUSION PUMP - OPTIME
INTRAMUSCULAR | Status: DC | PRN
  Administered 2017-05-08: 120 mg via INTRAVENOUS

## 2017-05-08 MED ORDER — HEMOSTATIC AGENTS (NO CHARGE) OPTIME
TOPICAL | Status: DC | PRN
  Administered 2017-05-08: 1 via TOPICAL

## 2017-05-08 MED ORDER — LIDOCAINE HCL (CARDIAC) 20 MG/ML IV SOLN
INTRAVENOUS | Status: DC | PRN
  Administered 2017-05-08: 80 mg via INTRAVENOUS

## 2017-05-08 MED ORDER — GABAPENTIN 300 MG PO CAPS
300.0000 mg | ORAL_CAPSULE | Freq: Three times a day (TID) | ORAL | Status: DC
  Administered 2017-05-08 – 2017-05-09 (×3): 300 mg via ORAL
  Filled 2017-05-08 (×3): qty 1

## 2017-05-08 MED ORDER — ONDANSETRON HCL 4 MG/2ML IJ SOLN
4.0000 mg | Freq: Four times a day (QID) | INTRAMUSCULAR | Status: DC | PRN
  Administered 2017-05-08: 4 mg via INTRAVENOUS
  Filled 2017-05-08: qty 2

## 2017-05-08 MED ORDER — 0.9 % SODIUM CHLORIDE (POUR BTL) OPTIME
TOPICAL | Status: DC | PRN
  Administered 2017-05-08: 1000 mL

## 2017-05-08 MED ORDER — PHENOL 1.4 % MT LIQD: 1.0000 | OROMUCOSAL | Status: DC | PRN

## 2017-05-08 MED ORDER — ALBUMIN HUMAN 5 % IV SOLN
INTRAVENOUS | Status: DC | PRN
  Administered 2017-05-08: 13:00:00 via INTRAVENOUS

## 2017-05-08 MED ORDER — PHENYLEPHRINE HCL 10 MG/ML IJ SOLN
INTRAVENOUS | Status: DC | PRN
  Administered 2017-05-08: 15 ug/min via INTRAVENOUS

## 2017-05-08 MED ORDER — PROPOFOL 10 MG/ML IV BOLUS
INTRAVENOUS | Status: DC | PRN
  Administered 2017-05-08: 50 mg via INTRAVENOUS
  Administered 2017-05-08: 150 mg via INTRAVENOUS

## 2017-05-08 MED ORDER — THROMBIN 20000 UNITS EX SOLR: CUTANEOUS | Status: DC | PRN

## 2017-05-08 MED ORDER — ALUM & MAG HYDROXIDE-SIMETH 200-200-20 MG/5ML PO SUSP: 30.0000 mL | Freq: Four times a day (QID) | ORAL | Status: DC | PRN

## 2017-05-08 MED ORDER — FLEET ENEMA 7-19 GM/118ML RE ENEM
1.0000 | ENEMA | Freq: Once | RECTAL | Status: AC | PRN
  Administered 2017-05-09: 1 via RECTAL
  Filled 2017-05-08: qty 1

## 2017-05-08 MED ORDER — BUPIVACAINE HCL (PF) 0.5 % IJ SOLN
INTRAMUSCULAR | Status: AC
  Filled 2017-05-08: qty 30

## 2017-05-08 MED ORDER — DIAZEPAM 5 MG PO TABS
ORAL_TABLET | ORAL | Status: AC
  Filled 2017-05-08: qty 1

## 2017-05-08 MED ORDER — DOCUSATE SODIUM 100 MG PO CAPS
100.0000 mg | ORAL_CAPSULE | Freq: Two times a day (BID) | ORAL | Status: DC
  Administered 2017-05-08 – 2017-05-09 (×2): 100 mg via ORAL
  Filled 2017-05-08 (×2): qty 1

## 2017-05-08 MED ORDER — OXYCODONE HCL 5 MG PO TABS
ORAL_TABLET | ORAL | Status: AC
  Filled 2017-05-08: qty 1

## 2017-05-08 MED ORDER — PROPOFOL 10 MG/ML IV BOLUS
INTRAVENOUS | Status: AC
  Filled 2017-05-08: qty 40

## 2017-05-08 MED ORDER — MORPHINE SULFATE (PF) 4 MG/ML IV SOLN
2.0000 mg | INTRAVENOUS | Status: DC | PRN
  Administered 2017-05-08 – 2017-05-09 (×3): 2 mg via INTRAVENOUS
  Filled 2017-05-08 (×3): qty 1

## 2017-05-08 MED FILL — Heparin Sodium (Porcine) Inj 1000 Unit/ML: INTRAMUSCULAR | Qty: 30 | Status: AC

## 2017-05-08 MED FILL — Sodium Chloride IV Soln 0.9%: INTRAVENOUS | Qty: 1000 | Status: AC

## 2017-05-08 SURGICAL SUPPLY — 120 items
APPLIER CLIP 11 MED OPEN (CLIP) ×3
BASE TI HL 10X42X30 20D (Bolt) ×3 IMPLANT
BASKET BONE COLLECTION (BASKET) IMPLANT
BLADE CLIPPER SURG (BLADE) ×6 IMPLANT
BOLT BASE TI 5X20 VARIABLE (Bolt) ×9 IMPLANT
BUR BARREL STRAIGHT FLUTE 4.0 (BURR) IMPLANT
CAGE MODULUS XLW 12X22X60 - 15 (Cage) ×3 IMPLANT
CANISTER SUCT 3000ML PPV (MISCELLANEOUS) ×3 IMPLANT
CARTRIDGE OIL MAESTRO DRILL (MISCELLANEOUS) IMPLANT
CLIP APPLIE 11 MED OPEN (CLIP) ×2 IMPLANT
CLIP LIGATING EXTRA MED SLVR (CLIP) IMPLANT
CLIP LIGATING EXTRA SM BLUE (MISCELLANEOUS) IMPLANT
CLIP NEUROVISION LG (CLIP) ×3 IMPLANT
CONT SPEC 4OZ CLIKSEAL STRL BL (MISCELLANEOUS) ×3 IMPLANT
COUNTER NEEDLE 20 DBL MAG RED (NEEDLE) ×6 IMPLANT
COVER BACK TABLE 24X17X13 BIG (DRAPES) IMPLANT
COVER BACK TABLE 60X90IN (DRAPES) ×3 IMPLANT
DECANTER SPIKE VIAL GLASS SM (MISCELLANEOUS) ×3 IMPLANT
DERMABOND ADVANCED (GAUZE/BANDAGES/DRESSINGS) ×2
DERMABOND ADVANCED .7 DNX12 (GAUZE/BANDAGES/DRESSINGS) ×4 IMPLANT
DIFFUSER DRILL AIR PNEUMATIC (MISCELLANEOUS) IMPLANT
DRAPE C-ARM 42X72 X-RAY (DRAPES) ×9 IMPLANT
DRAPE C-ARMOR (DRAPES) ×9 IMPLANT
DRAPE LAPAROTOMY 100X72X124 (DRAPES) ×9 IMPLANT
DRAPE POUCH INSTRU U-SHP 10X18 (DRAPES) ×9 IMPLANT
DRAPE SURG 17X23 STRL (DRAPES) ×6 IMPLANT
DRSG OPSITE POSTOP 3X4 (GAUZE/BANDAGES/DRESSINGS) ×9 IMPLANT
DRSG OPSITE POSTOP 4X6 (GAUZE/BANDAGES/DRESSINGS) ×6 IMPLANT
DRSG OPSITE POSTOP 4X8 (GAUZE/BANDAGES/DRESSINGS) ×6 IMPLANT
DURAPREP 26ML APPLICATOR (WOUND CARE) ×9 IMPLANT
ELECT BLADE 4.0 EZ CLEAN MEGAD (MISCELLANEOUS) ×6
ELECT REM PT RETURN 9FT ADLT (ELECTROSURGICAL) ×6
ELECTRODE BLDE 4.0 EZ CLN MEGD (MISCELLANEOUS) ×4 IMPLANT
ELECTRODE REM PT RTRN 9FT ADLT (ELECTROSURGICAL) ×4 IMPLANT
GAUZE SPONGE 4X4 12PLY STRL (GAUZE/BANDAGES/DRESSINGS) IMPLANT
GAUZE SPONGE 4X4 16PLY XRAY LF (GAUZE/BANDAGES/DRESSINGS) ×3 IMPLANT
GLOVE BIO SURGEON STRL SZ8 (GLOVE) ×18 IMPLANT
GLOVE BIOGEL PI IND STRL 8 (GLOVE) ×12 IMPLANT
GLOVE BIOGEL PI IND STRL 8.5 (GLOVE) ×10 IMPLANT
GLOVE BIOGEL PI INDICATOR 8 (GLOVE) ×6
GLOVE BIOGEL PI INDICATOR 8.5 (GLOVE) ×5
GLOVE ECLIPSE 8.0 STRL XLNG CF (GLOVE) ×18 IMPLANT
GLOVE EXAM NITRILE LRG STRL (GLOVE) ×36 IMPLANT
GLOVE EXAM NITRILE XL STR (GLOVE) ×36 IMPLANT
GLOVE EXAM NITRILE XS STR PU (GLOVE) ×12 IMPLANT
GLOVE SS BIOGEL STRL SZ 7.5 (GLOVE) ×2 IMPLANT
GLOVE SUPERSENSE BIOGEL SZ 7.5 (GLOVE) ×1
GOWN STRL REUS W/ TWL LRG LVL3 (GOWN DISPOSABLE) ×4 IMPLANT
GOWN STRL REUS W/ TWL XL LVL3 (GOWN DISPOSABLE) ×14 IMPLANT
GOWN STRL REUS W/TWL 2XL LVL3 (GOWN DISPOSABLE) ×21 IMPLANT
GOWN STRL REUS W/TWL LRG LVL3 (GOWN DISPOSABLE) ×2
GOWN STRL REUS W/TWL XL LVL3 (GOWN DISPOSABLE) ×7
GUIDEWIRE NITINOL BEVEL TIP (WIRE) ×24 IMPLANT
INSERT FOGARTY 61MM (MISCELLANEOUS) IMPLANT
INSERT FOGARTY SM (MISCELLANEOUS) IMPLANT
KIT BASIN OR (CUSTOM PROCEDURE TRAY) ×3 IMPLANT
KIT DILATOR XLIF 5 (KITS) ×2 IMPLANT
KIT INFUSE X SMALL 1.4CC (Orthopedic Implant) ×3 IMPLANT
KIT INFUSE XX SMALL 0.7CC (Orthopedic Implant) ×3 IMPLANT
KIT POSITION SURG JACKSON T1 (MISCELLANEOUS) ×3 IMPLANT
KIT ROOM TURNOVER OR (KITS) ×3 IMPLANT
KIT SURGICAL ACCESS MAXCESS 4 (KITS) ×3 IMPLANT
KIT XLIF (KITS) ×1
LOOP VESSEL MAXI BLUE (MISCELLANEOUS) IMPLANT
LOOP VESSEL MINI RED (MISCELLANEOUS) IMPLANT
MARKER SKIN DUAL TIP RULER LAB (MISCELLANEOUS) ×6 IMPLANT
MODULE NVM5 NEXT GEN EMG (NEEDLE) ×3 IMPLANT
MODULUS XLW 12X22X60MM 10 (Spine Construct) ×3 IMPLANT
NEEDLE HYPO 21X1.5 SAFETY (NEEDLE) ×3 IMPLANT
NEEDLE HYPO 25X1 1.5 SAFETY (NEEDLE) ×6 IMPLANT
NEEDLE I PASS (NEEDLE) ×3 IMPLANT
NEEDLE SPNL 18GX3.5 QUINCKE PK (NEEDLE) ×3 IMPLANT
NS IRRIG 1000ML POUR BTL (IV SOLUTION) ×3 IMPLANT
OIL CARTRIDGE MAESTRO DRILL (MISCELLANEOUS)
PACK LAMINECTOMY NEURO (CUSTOM PROCEDURE TRAY) ×9 IMPLANT
PAD ARMBOARD 7.5X6 YLW CONV (MISCELLANEOUS) ×15 IMPLANT
PATTIES SURGICAL .5 X.5 (GAUZE/BANDAGES/DRESSINGS) IMPLANT
PATTIES SURGICAL .5 X1 (DISPOSABLE) IMPLANT
PATTIES SURGICAL 1X1 (DISPOSABLE) IMPLANT
PUTTY BONE ATTRAX 10CC STRIP (Putty) ×6 IMPLANT
PUTTY BONE ATTRAX 5CC STRIP (Putty) ×3 IMPLANT
ROD RELINE MAS LORD 5.5X120MM (Rod) ×6 IMPLANT
SCREW LOCK RELINE 5.5 TULIP (Screw) ×24 IMPLANT
SCREW MAS RELINE 6.5X45 POLY (Screw) ×12 IMPLANT
SCREW RELINE MAS POLY 7.5X45MM (Screw) ×12 IMPLANT
SPONGE INTESTINAL PEANUT (DISPOSABLE) ×6 IMPLANT
SPONGE LAP 18X18 X RAY DECT (DISPOSABLE) ×3 IMPLANT
SPONGE LAP 4X18 X RAY DECT (DISPOSABLE) IMPLANT
SPONGE SURGIFOAM ABS GEL 100 (HEMOSTASIS) ×3 IMPLANT
SPONGE SURGIFOAM ABS GEL SZ50 (HEMOSTASIS) IMPLANT
STAPLER SKIN PROX WIDE 3.9 (STAPLE) ×3 IMPLANT
STAPLER VISISTAT 35W (STAPLE) IMPLANT
SUT PDS AB 1 CTX 36 (SUTURE) ×3 IMPLANT
SUT PROLENE 4 0 RB 1 (SUTURE)
SUT PROLENE 4-0 RB1 .5 CRCL 36 (SUTURE) IMPLANT
SUT PROLENE 5 0 CC1 (SUTURE) IMPLANT
SUT PROLENE 6 0 C 1 30 (SUTURE) IMPLANT
SUT PROLENE 6 0 CC (SUTURE) IMPLANT
SUT SILK 0 TIES 10X30 (SUTURE) IMPLANT
SUT SILK 2 0 TIES 10X30 (SUTURE) ×3 IMPLANT
SUT SILK 2 0 TIES 17X18 (SUTURE) ×1
SUT SILK 2 0SH CR/8 30 (SUTURE) IMPLANT
SUT SILK 2-0 18XBRD TIE BLK (SUTURE) ×2 IMPLANT
SUT SILK 3 0 TIES 10X30 (SUTURE) IMPLANT
SUT SILK 3 0SH CR/8 30 (SUTURE) IMPLANT
SUT VIC AB 1 CT1 18XBRD ANBCTR (SUTURE) ×10 IMPLANT
SUT VIC AB 1 CT1 8-18 (SUTURE) ×5
SUT VIC AB 2-0 CT1 18 (SUTURE) ×15 IMPLANT
SUT VIC AB 2-0 CT1 36 (SUTURE) IMPLANT
SUT VIC AB 2-0 CTX 36 (SUTURE) IMPLANT
SUT VIC AB 3-0 SH 27 (SUTURE)
SUT VIC AB 3-0 SH 27X BRD (SUTURE) IMPLANT
SUT VIC AB 3-0 SH 8-18 (SUTURE) ×15 IMPLANT
SUT VICRYL 4-0 PS2 18IN ABS (SUTURE) IMPLANT
SYR INSULIN 1ML 31GX6 SAFETY (SYRINGE) IMPLANT
SYRINGE 20CC LL (MISCELLANEOUS) ×3 IMPLANT
TOWEL GREEN STERILE (TOWEL DISPOSABLE) ×6 IMPLANT
TOWEL GREEN STERILE FF (TOWEL DISPOSABLE) ×6 IMPLANT
TRAY FOLEY W/METER SILVER 16FR (SET/KITS/TRAYS/PACK) ×3 IMPLANT
WATER STERILE IRR 1000ML POUR (IV SOLUTION) ×3 IMPLANT

## 2017-05-08 NOTE — Op Note (Signed)
    OPERATIVE REPORT  DATE OF SURGERY: 05/08/2017  PATIENT: Gabriel Cummings, 66 y.o. male MRN: [id number]  DOB: 02/05/1951  PRE-OPERATIVE DIAGNOSIS: Degenerative disc disease  POST-OPERATIVE DIAGNOSIS:  Same  PROCEDURE: Anterior exposure for L5-S1 disc surgery  SURGEON:  Curt Jews, M.D.  Co-surgeon for the exposure Dr. Dierdre Harness  PHYSICIAN ASSISTANT: Aaron Edelman Petitt RN  ANESTHESIA:  Gen.  EBL: 250 ml  Total I/O In: 2750 [I.V.:2500; IV Piggyback:250] Out: 510 [Urine:260; Blood:250]  BLOOD ADMINISTERED: None  DRAINS: None  SPECIMEN: None  COUNTS CORRECT:  YES  PLAN OF CARE: PACU   PATIENT DISPOSITION:  PACU - hemodynamically stable  PROCEDURE DETAILS: Patient was taken to the operative placed supine position where the area the abdomen was prepped in usual sterile fashion. C-arm was used identify the level of the L5-S1 disc relation to the skin. An incision was made from the midline to the left above the pubic bone. History down through the subcutaneous fat and the skin incision. The anterior rectus sheath was opened in line with skin incision as well. The rectus muscle was mobilized. Patient had prior bilateral hernia repair. He had a very large mass that was lying below the level of the rectus sheath. The retrogradecould not be entered bluntly related to the mesh. For this reason the posterior rectus sheath was opened laterally above the level of the mesh and blunt dissection was used to mobilize the peritoneal contents to the right. Dissection was continued above the level of the psoas muscles. The dissection was continued reflecting the left ureter to the right. The iliac vessels were identified over the L5-S1 disc. The middle sacral vessels were divided between ligaclips. The iliac vessels were mobilized to the right and left of the L5-S1 disc. The omni-Trac Thompson retractor was brought onto the field and the reverse lip 150 blades were positioned to the right and left of  the L5-S1 disc. Malleable retractors were used for superior and inferior exposure. A needle was placed in the disc space and C-arm was brought back onto the field to confirm that this was the L5-S1 disc. The remainder the procedure will be dictated as a separate note by Dr. Gari Crown, M.D., [facility] 05/08/2017 2:57 PM

## 2017-05-08 NOTE — Op Note (Signed)
05/08/2017  2:16 PM  PATIENT:  Gabriel Cummings  66 y.o. male  PRE-OPERATIVE DIAGNOSIS:  Spinal stenosis, Lumbar region with neurogenic claudication, lumbar scoliosis, sagittal plane imbalance, lumbago, lumbar radiculopathy  POST-OPERATIVE DIAGNOSIS:  Spinal stenosis, Lumbar region with neurogenic claudication, lumbar scoliosis, sagittal plane imbalance, lumbago, lumbar radiculopathy  PROCEDURE:  Procedure(s) with comments: L5-S1 Anterior lumbar interbody fusion with Dr. Sherren Mocha Early for approach (N/A) - L5-S1 Anterior lumbar interbody fusion with Dr. Sherren Mocha Early for approach Left L3-4 L4-5 Anterolateral lumbar interbody fusion (Left) - Left L3-4 L4-5 Anterolateral lumbar interbody fusion L3-S1 Percutaneous pedicle screws (N/A) - L3-S1 Percutaneous pedicle screws ABDOMINAL EXPOSURE (N/A)  SURGEON:  Surgeon(s) and Role: Panel 1:    Erline Levine, MD - Primary    Kristeen Miss, MD - Assisting  Panel 2:    * Early, Arvilla Meres, MD - Primary  PHYSICIAN ASSISTANT:   ASSISTANTS: Poteat, RN   ANESTHESIA:   general  EBL:  Total I/O In: 2750 [I.V.:2500; IV Piggyback:250] Out: 510 [Urine:260; Blood:250]  BLOOD ADMINISTERED:none  DRAINS: none   LOCAL MEDICATIONS USED:  MARCAINE    and LIDOCAINE   SPECIMEN:  No Specimen  DISPOSITION OF SPECIMEN:  N/A  COUNTS:  YES  TOURNIQUET:  * No tourniquets in log *  DICTATION:   INDICATIONS:  Pateint is 66 year old male with chronic and intractable back and bilateral lower extremity pain, left greater than right with lumbar scoliosis, stenosis, sagittal plane imbalance, lumbago, radiculopathy.   It was elected to take him to surgery for anterior lumbar decompression at L 5 S 1 and XLIF fusion at the  L 34, L45 levels with percutaneous screws L 3- S 1 levels.   PROCEDURE:  Doctor Early performed exposure and his portion of the procedure will be dictated separately.  Upon exposing the L 5 S1 level, a localizing X ray was obtained with the C arm.   I then incised the anterior annulus and performed a thorough discectomy with wide ligamentous releases.  The endplates were cleared of disc and cartilagenous material and a thorough discectomy was performed with decompression of the ventral annulus and disc material.  After trials, a 20 degree, 10 x 42 x 30 Base lordotic ALIF cage was placed and lagged with 3, 5.0 x 20 mm screws, one in L 5 and two in S 1. This titanium spacer which was selected, packed with extra extra small BMP and Attrax.  The implant was tamped into position and positioning was confirmed with C arm.   Locking mechanisms were engaged, soft tissues were inspected and found to be in good repair.   Fascia was closed with 1 PDS running stitch, skin edges closed with 2-0 and 3-0 vicryl sutures.  Wound was dressed with a sterile occlusive dressing.    Patient was then  placed in a right lateral decubitus position on the operative table and using orthogonally projected C-arm fluoroscopy the patient was placed so that the L 45 and L 34 levels were visualized in AP and lateral plane. The patient was then taped into position.  Skin was marked along with a posterior finger dissection incision. His flank was then prepped and draped in usual sterile fashion and incisions were made sequentially at L 45 level along the iliac crest and then at L3  working between the 11 th and 12 th ribs. Finger dissection was made to enter the retroperitoneal space and then subsequently the probe was inserted into the psoas muscle from  the right side initially at the L45 level. After mapping the neural elements were able to dock the probe per the midpoint of this vertebral level and without indications electrically of too close proximity to the neural tissues. Subsequently the self-retaining tractor was.after sequential dilators were utilized the shim was employed and the interspace was cleared of psoas muscle and then incised. A thorough discectomy was performed. Instruments  were used to clear the interspace of disc material. After thorough discectomy was performed and this was performed using AP and lateral fluoroscopy a 15 degree lordotic by 60 x 22 x 12 mm implant was packed with extra small  BMP and Attrax. This was tamped into position using the slides and its position was confirmed on AP and lateral fluoroscopy.  Hemostasis was assured the wounds were irrigated interrupted Vicryl sutures.Finger dissection was made to enter the retroperitoneal space and then subsequently the probe was inserted into the psoas muscle from the right side initially at the L34 level. After mapping the neural elements were able to dock the probe per the midpoint of this vertebral level and without indications electrically of too close proximity to the neural tissues. Subsequently the self-retaining tractor was.after sequential dilators were utilized the shim was employed and the interspace was cleared of psoas muscle and then incised. A thorough discectomy was performed. Instruments were used to clear the interspace of disc material. After thorough discectomy was performed and this was performed using AP and lateral fluoroscopy a 10 degree lordotic by 60 x 22 x 12 mm implant was packed with remaining BMP and Attrax. This was tamped into position using the slides and its position was confirmed on AP and lateral fluoroscopy.  Hemostasis was assured.  Hemostasis was assured at each level.  Incisions were closed with 1, 2-0, 3-0 vicryl sutures and dressed with Dermabond and  occlusive dressings.  The patient was then turned into a prone position on the [place] table on chest rolls and using AP and lateral fluoroscopy throughout this portion of the procedure, pedicle screws were placed using Nuvasive Reline cannulated percutaneous screws. After placing guide wires at each level with the use of nerve monitoring throughout, Nuvasive Reline towers were docked on the  L 3, L 4, L 5 S 1 levels.  Pedicle screws  were placed bilaterally at L 3 (6.5 x 45), 2 at L 4 6.5 45), 2 at L 5 (7.5 x 45) and S1 (7.5 x 45 bilaterally). 120 mm rods were then affixed to the screw heads through a separate stab incision and locked down on the screws. All connections were then torqued and the Towers were disassembled. The wounds were irrigated and then closed with 1, 2-0 and 3-0 Vicryl stitches. Long-acting Marcaine was infiltrated in subcutaneous tissues.  Sterile occlusive dressing was placed with Dermabond and occlusive dressings. The patient was then extubated in the operating room and taken to recovery in stable and satisfactory condition having tolerated his operation well. Counts were correct at the end of the case.   Patient was extubated in the OR and taken to recovery having tolerated his surgery well.  Counts were correct.    Retractor Times: L 34 (22:36 mins); L 45 (20:11 mins).  Pelvic Parameters:  Preop:   PI 49 degrees; LL 58; PI- LL -9 degree; SVA 175  mm;     PLAN OF CARE: Admit to inpatient   PATIENT DISPOSITION:  PACU - hemodynamically stable.   Delay start of Pharmacological VTE agent (>24hrs) due to  surgical blood loss or risk of bleeding: yes

## 2017-05-08 NOTE — Brief Op Note (Signed)
05/08/2017  2:16 PM  PATIENT:  Gabriel Cummings  66 y.o. male  PRE-OPERATIVE DIAGNOSIS:  Spinal stenosis, Lumbar region with neurogenic claudication, lumbar scoliosis, sagittal plane imbalance, lumbago, lumbar radiculopathy  POST-OPERATIVE DIAGNOSIS:  Spinal stenosis, Lumbar region with neurogenic claudication, lumbar scoliosis, sagittal plane imbalance, lumbago, lumbar radiculopathy  PROCEDURE:  Procedure(s) with comments: L5-S1 Anterior lumbar interbody fusion with Dr. Sherren Mocha Early for approach (N/A) - L5-S1 Anterior lumbar interbody fusion with Dr. Sherren Mocha Early for approach Left L3-4 L4-5 Anterolateral lumbar interbody fusion (Left) - Left L3-4 L4-5 Anterolateral lumbar interbody fusion L3-S1 Percutaneous pedicle screws (N/A) - L3-S1 Percutaneous pedicle screws ABDOMINAL EXPOSURE (N/A)  SURGEON:  Surgeon(s) and Role: Panel 1:    Erline Levine, MD - Primary    Kristeen Miss, MD - Assisting  Panel 2:    * Early, Arvilla Meres, MD - Primary  PHYSICIAN ASSISTANT:   ASSISTANTS: Poteat, RN   ANESTHESIA:   general  EBL:  Total I/O In: 2750 [I.V.:2500; IV Piggyback:250] Out: 510 [Urine:260; Blood:250]  BLOOD ADMINISTERED:none  DRAINS: none   LOCAL MEDICATIONS USED:  MARCAINE    and LIDOCAINE   SPECIMEN:  No Specimen  DISPOSITION OF SPECIMEN:  N/A  COUNTS:  YES  TOURNIQUET:  * No tourniquets in log *  DICTATION:   INDICATIONS:  Pateint is 66 year old male with chronic and intractable back and bilateral lower extremity pain, left greater than right with lumbar scoliosis, stenosis, sagittal plane imbalance, lumbago, radiculopathy.   It was elected to take him to surgery for anterior lumbar decompression at L 5 S 1 and XLIF fusion at the  L 34, L45 levels with percutaneous screws L 3- S 1 levels.   PROCEDURE:  Doctor Early performed exposure and his portion of the procedure will be dictated separately.  Upon exposing the L 5 S1 level, a localizing X ray was obtained with the C arm.   I then incised the anterior annulus and performed a thorough discectomy with wide ligamentous releases.  The endplates were cleared of disc and cartilagenous material and a thorough discectomy was performed with decompression of the ventral annulus and disc material.  After trials, a 20 degree, 10 x 42 x 30 Base lordotic ALIF cage was placed and lagged with 3, 5.0 x 20 mm screws, one in L 5 and two in S 1. This titanium spacer which was selected, packed with extra extra small BMP and Attrax.  The implant was tamped into position and positioning was confirmed with C arm.   Locking mechanisms were engaged, soft tissues were inspected and found to be in good repair.   Fascia was closed with 1 PDS running stitch, skin edges closed with 2-0 and 3-0 vicryl sutures.  Wound was dressed with a sterile occlusive dressing.    Patient was then  placed in a right lateral decubitus position on the operative table and using orthogonally projected C-arm fluoroscopy the patient was placed so that the L 45 and L 34 levels were visualized in AP and lateral plane. The patient was then taped into position.  Skin was marked along with a posterior finger dissection incision. His flank was then prepped and draped in usual sterile fashion and incisions were made sequentially at L 45 level along the iliac crest and then at L3  working between the 11 th and 12 th ribs. Finger dissection was made to enter the retroperitoneal space and then subsequently the probe was inserted into the psoas muscle from  the right side initially at the L45 level. After mapping the neural elements were able to dock the probe per the midpoint of this vertebral level and without indications electrically of too close proximity to the neural tissues. Subsequently the self-retaining tractor was.after sequential dilators were utilized the shim was employed and the interspace was cleared of psoas muscle and then incised. A thorough discectomy was performed. Instruments  were used to clear the interspace of disc material. After thorough discectomy was performed and this was performed using AP and lateral fluoroscopy a 15 degree lordotic by 60 x 22 x 12 mm implant was packed with extra small  BMP and Attrax. This was tamped into position using the slides and its position was confirmed on AP and lateral fluoroscopy.  Hemostasis was assured the wounds were irrigated interrupted Vicryl sutures.Finger dissection was made to enter the retroperitoneal space and then subsequently the probe was inserted into the psoas muscle from the right side initially at the L34 level. After mapping the neural elements were able to dock the probe per the midpoint of this vertebral level and without indications electrically of too close proximity to the neural tissues. Subsequently the self-retaining tractor was.after sequential dilators were utilized the shim was employed and the interspace was cleared of psoas muscle and then incised. A thorough discectomy was performed. Instruments were used to clear the interspace of disc material. After thorough discectomy was performed and this was performed using AP and lateral fluoroscopy a 10 degree lordotic by 60 x 22 x 12 mm implant was packed with remaining BMP and Attrax. This was tamped into position using the slides and its position was confirmed on AP and lateral fluoroscopy.  Hemostasis was assured.  Hemostasis was assured at each level.  Incisions were closed with 1, 2-0, 3-0 vicryl sutures and dressed with Dermabond and  occlusive dressings.  The patient was then turned into a prone position on the [place] table on chest rolls and using AP and lateral fluoroscopy throughout this portion of the procedure, pedicle screws were placed using Nuvasive Reline cannulated percutaneous screws. After placing guide wires at each level with the use of nerve monitoring throughout, Nuvasive Reline towers were docked on the  L 3, L 4, L 5 S 1 levels.  Pedicle screws  were placed bilaterally at L 3 (6.5 x 45), 2 at L 4 6.5 45), 2 at L 5 (7.5 x 45) and S1 (7.5 x 45 bilaterally). 120 mm rods were then affixed to the screw heads through a separate stab incision and locked down on the screws. All connections were then torqued and the Towers were disassembled. The wounds were irrigated and then closed with 1, 2-0 and 3-0 Vicryl stitches. Long-acting Marcaine was infiltrated in subcutaneous tissues.  Sterile occlusive dressing was placed with Dermabond and occlusive dressings. The patient was then extubated in the operating room and taken to recovery in stable and satisfactory condition having tolerated his operation well. Counts were correct at the end of the case.   Patient was extubated in the OR and taken to recovery having tolerated his surgery well.  Counts were correct.    Retractor Times: L 34 (22:36 mins); L 45 (20:11 mins).  Pelvic Parameters:  Preop:   PI 49 degrees; LL 58; PI- LL -9 degree; SVA 175  mm;     PLAN OF CARE: Admit to inpatient   PATIENT DISPOSITION:  PACU - hemodynamically stable.   Delay start of Pharmacological VTE agent (>24hrs) due to  surgical blood loss or risk of bleeding: yes

## 2017-05-08 NOTE — Transfer of Care (Signed)
Immediate Anesthesia Transfer of Care Note  Patient: Gabriel Cummings  Procedure(s) Performed: Procedure(s) with comments: L5-S1 Anterior lumbar interbody fusion with Dr. Sherren Mocha Early for approach (N/A) - L5-S1 Anterior lumbar interbody fusion with Dr. Sherren Mocha Early for approach Left L3-4 L4-5 Anterolateral lumbar interbody fusion (Left) - Left L3-4 L4-5 Anterolateral lumbar interbody fusion L3-S1 Percutaneous pedicle screws (N/A) - L3-S1 Percutaneous pedicle screws ABDOMINAL EXPOSURE (N/A)  Patient Location: PACU  Anesthesia Type:General  Level of Consciousness: drowsy and patient cooperative  Airway & Oxygen Therapy: Patient Spontanous Breathing and Patient connected to nasal cannula oxygen  Post-op Assessment: Report given to RN and Post -op Vital signs reviewed and stable  Post vital signs: Reviewed and stable  Last Vitals:  Vitals:   05/08/17 0620  BP: 120/83  Pulse: 68  Resp: 18  Temp: 36.6 C    Last Pain:  Vitals:   05/08/17 0620  TempSrc: Oral      Patients Stated Pain Goal: 3 (23/55/73 2202)  Complications: No apparent anesthesia complications

## 2017-05-08 NOTE — Anesthesia Procedure Notes (Signed)
Arterial Line Insertion Start/End7/30/2018 7:10 AM, 05/08/2017 7:15 AM Performed by: Leonor Liv, CRNA  Patient location: Pre-op. Preanesthetic checklist: patient identified, risks and benefits discussed, pre-op evaluation and timeout performed Lidocaine 1% used for infiltration Right, radial was placed Catheter size: 20 G Hand hygiene performed , maximum sterile barriers used  and Seldinger technique used Allen's test indicative of satisfactory collateral circulation Attempts: 1 Procedure performed without using ultrasound guided technique. Following insertion, dressing applied and Biopatch. Post procedure assessment: normal and unchanged  Patient tolerated the procedure well with no immediate complications.

## 2017-05-08 NOTE — Anesthesia Procedure Notes (Signed)
Procedure Name: Intubation Date/Time: 05/08/2017 7:38 AM Performed by: Valda Favia Pre-anesthesia Checklist: Patient identified, Emergency Drugs available, Suction available, Patient being monitored and Timeout performed Patient Re-evaluated:Patient Re-evaluated prior to induction Oxygen Delivery Method: Circle system utilized Preoxygenation: Pre-oxygenation with 100% oxygen Induction Type: IV induction Ventilation: Mask ventilation without difficulty Laryngoscope Size: Mac and 4 Grade View: Grade I Tube type: Oral Tube size: 7.5 mm Number of attempts: 1 Airway Equipment and Method: Stylet Placement Confirmation: ETT inserted through vocal cords under direct vision,  positive ETCO2 and breath sounds checked- equal and bilateral Secured at: 23 cm Tube secured with: Tape Dental Injury: Teeth and Oropharynx as per pre-operative assessment

## 2017-05-08 NOTE — Anesthesia Postprocedure Evaluation (Signed)
Anesthesia Post Note  Patient: Gabriel Cummings  Procedure(s) Performed: Procedure(s) (LRB): L5-S1 Anterior lumbar interbody fusion with Dr. Sherren Mocha Early for approach (N/A) Left L3-4 L4-5 Anterolateral lumbar interbody fusion (Left) L3-S1 Percutaneous pedicle screws (N/A) ABDOMINAL EXPOSURE (N/A)     Patient location during evaluation: PACU Anesthesia Type: General Level of consciousness: awake and alert Pain management: pain level controlled Vital Signs Assessment: post-procedure vital signs reviewed and stable Respiratory status: spontaneous breathing, nonlabored ventilation, respiratory function stable and patient connected to nasal cannula oxygen Cardiovascular status: blood pressure returned to baseline and stable Postop Assessment: no signs of nausea or vomiting Anesthetic complications: no    Last Vitals:  Vitals:   05/08/17 1556 05/08/17 1610  BP:  (!) 159/93  Pulse: 74 74  Resp: 18 18  Temp: (!) 36.1 C 36.9 C    Last Pain:  Vitals:   05/08/17 1549  TempSrc:   PainSc: 0-No pain                 Tiajuana Amass

## 2017-05-08 NOTE — Interval H&P Note (Signed)
History and Physical Interval Note:  05/08/2017 7:22 AM  Gabriel Cummings  has presented today for surgery, with the diagnosis of Spinal stenosis, Lumbar region with neurogenic claudication  The various methods of treatment have been discussed with the patient and family. After consideration of risks, benefits and other options for treatment, the patient has consented to  Procedure(s) with comments: L5-S1 Anterior lumbar interbody fusion with Dr. Sherren Mocha Early for approach (N/A) - L5-S1 Anterior lumbar interbody fusion with Dr. Sherren Mocha Early for approach Left L3-4 L4-5 Anterolateral lumbar interbody fusion (Left) - Left L3-4 L4-5 Anterolateral lumbar interbody fusion L3-S1 Percutaneous pedicle screws (N/A) - L3-S1 Percutaneous pedicle screws ABDOMINAL EXPOSURE (N/A) as a surgical intervention .  The patient's history has been reviewed, patient examined, no change in status, stable for surgery.  I have reviewed the patient's chart and labs.  Questions were answered to the patient's satisfaction.     [name]

## 2017-05-08 NOTE — Progress Notes (Signed)
Subjective: Patient reports "I'm doing good"  Objective: Vital signs in last 24 hours: Temp:  [96.9 F (36.1 C)-98.4 F (36.9 C)] 98.4 F (36.9 C) (07/30 1610) Pulse Rate:  [68-85] 74 (07/30 1610) Resp:  [7-18] 18 (07/30 1610) BP: (120-159)/(80-96) 159/93 (07/30 1610) SpO2:  [98 %-100 %] 98 % (07/30 1610) Arterial Line BP: (162-177)/(78-86) 163/81 (07/30 1456) Weight:  [93.9 kg (207 lb)] 93.9 kg (207 lb) (07/30 0620)  Intake/Output from previous day: No intake/output data recorded. Intake/Output this shift: Total I/O In: 2750 [I.V.:2500; IV Piggyback:250] Out: 710 [Urine:460; Blood:250]  Awakens to voice. Wife reports he responded well to morphine. Good strength BLE. Incisions clean & dry beneath honeycomb drsgs.   Lab Results: No results for input(s): WBC, HGB, HCT, PLT in the last 72 hours. BMET No results for input(s): NA, K, CL, CO2, GLUCOSE, BUN, CREATININE, CALCIUM in the last 72 hours.  Studies/Results: Dg Lumbar Spine Complete  Result Date: 05/08/2017 CLINICAL DATA:  66 year old male undergoing lumbar spine surgery. EXAM: DG C-ARM GT 120 MIN; LUMBAR SPINE - COMPLETE 4+ VIEW FLUOROSCOPY TIME:  Fluoroscopy Time:  2 minutes and 51 seconds Radiation Exposure Index (if provided by the fluoroscopic device): Number of Acquired Spot Images: 0 COMPARISON:  Lumbar MRI 02/08/2017.  Lumbar radiographs 01/09/2017. FINDINGS: Normal lumbar segmentation with full size ribs at T12 demonstrated on the 01/09/2017 radiographs, which is the same numbering system used on the 02/08/2017 lumbar MRI. Mild grade 1 anterolisthesis at L5-S1. Eight intraoperative fluoroscopic spot views of the lumbar spine. Anterior approach interbody implant placement at L5-S1 demonstrated, followed by lateral type approach interbody implants at L3-L4 and L4-L5. On the final two images bilateral posterior transpedicular hardware has been placed from the L3 to the S1 level. IMPRESSION: L3 through S1 posterior and  interbody fusion. Electronically Signed   By: Genevie Ann M.D.   On: 05/08/2017 14:15   Dg C-arm Gt 120 Min  Result Date: 05/08/2017 CLINICAL DATA:  66 year old male undergoing lumbar spine surgery. EXAM: DG C-ARM GT 120 MIN; LUMBAR SPINE - COMPLETE 4+ VIEW FLUOROSCOPY TIME:  Fluoroscopy Time:  2 minutes and 51 seconds Radiation Exposure Index (if provided by the fluoroscopic device): Number of Acquired Spot Images: 0 COMPARISON:  Lumbar MRI 02/08/2017.  Lumbar radiographs 01/09/2017. FINDINGS: Normal lumbar segmentation with full size ribs at T12 demonstrated on the 01/09/2017 radiographs, which is the same numbering system used on the 02/08/2017 lumbar MRI. Mild grade 1 anterolisthesis at L5-S1. Eight intraoperative fluoroscopic spot views of the lumbar spine. Anterior approach interbody implant placement at L5-S1 demonstrated, followed by lateral type approach interbody implants at L3-L4 and L4-L5. On the final two images bilateral posterior transpedicular hardware has been placed from the L3 to the S1 level. IMPRESSION: L3 through S1 posterior and interbody fusion. Electronically Signed   By: Genevie Ann M.D.   On: 05/08/2017 14:15   Dg Or Local Abdomen  Result Date: 05/08/2017 CLINICAL DATA:  Status post L5-S1 ALIF. No missing instruments, obtained per protocol for anterior exposure. EXAM: OR LOCAL ABDOMEN COMPARISON:  Preoperative MRI 02/08/2017 FINDINGS: There is an ALIF in place at L5-S1. Vascular clips are seen in the operative region. No unexpected foreign body or acute osseous finding. These results were called by telephone at the time of interpretation on 05/08/2017 at 10:13 am to [place], who verbally acknowledged these results. IMPRESSION: No unexpected foreign body. Electronically Signed   By: Monte Fantasia M.D.   On: 05/08/2017 10:14    Assessment/Plan:  LOS: 0 days  Mobilize in brace.    Gabriel Cummings 05/08/2017, 5:14 PM

## 2017-05-09 ENCOUNTER — Encounter (HOSPITAL_COMMUNITY): Payer: Self-pay | Admitting: Neurosurgery

## 2017-05-09 LAB — GLUCOSE, CAPILLARY
Glucose-Capillary: 109 mg/dL — ABNORMAL HIGH (ref 65–99)
Glucose-Capillary: 129 mg/dL — ABNORMAL HIGH (ref 65–99)

## 2017-05-09 MED ORDER — OXYCODONE-ACETAMINOPHEN 10-325 MG PO TABS: 1.0000 | ORAL_TABLET | ORAL | 0 refills | Status: DC | PRN

## 2017-05-09 MED ORDER — MAGNESIUM CITRATE PO SOLN
1.0000 | Freq: Once | ORAL | Status: AC
  Administered 2017-05-09: 1 via ORAL
  Filled 2017-05-09: qty 296

## 2017-05-09 MED ORDER — DIAZEPAM 5 MG PO TABS: 5.0000 mg | ORAL_TABLET | Freq: Four times a day (QID) | ORAL | 0 refills | Status: DC | PRN

## 2017-05-09 MED ORDER — METHOCARBAMOL 500 MG PO TABS: 500.0000 mg | ORAL_TABLET | Freq: Four times a day (QID) | ORAL | 1 refills | Status: DC | PRN

## 2017-05-09 NOTE — Discharge Summary (Addendum)
Physician Discharge Summary  Patient ID: Gabriel Cummings MRN: [id number] DOB/AGE: 1951/01/08 66 y.o.  Admit date: 05/08/2017 Discharge date: 05/09/2017  Admission Diagnoses:Spinal stenosis, Lumbar region with neurogenic claudication, lumbar scoliosis, sagittal plane imbalance, lumbago, lumbar radiculopathy     Discharge Diagnoses: Spinal stenosis, Lumbar region with neurogenic claudication, lumbar scoliosis, sagittal plane imbalance, lumbago, lumbar radiculopathy s/p L5-S1 Anterior lumbar interbody fusion with Dr. Sherren Mocha Early for approach (N/A) - L5-S1 Anterior lumbar interbody fusion with Dr. Sherren Mocha Early for approach Left L3-4 L4-5 Anterolateral lumbar interbody fusion (Left) - Left L3-4 L4-5 Anterolateral lumbar interbody fusion L3-S1 Percutaneous pedicle screws (N/A) - L3-S1 Percutaneous pedicle screws ABDOMINAL EXPOSURE (N/A)    Active Problems:   Lumbar scoliosis   Discharged Condition: good  Hospital Course: Durelle Zepeda was admitted for surgery with dx lumbar stenosis and radiculopathy. Following uncomplicated surgery , he recovered nicely and transferred to [facility] for nursing care and therapies. He is mobilizing well.   Consults: None  Significant Diagnostic Studies: radiology: X-Ray: intra-op  Treatments: surgery: L5-S1 Anterior lumbar interbody fusion with Dr. Sherren Mocha Early for approach (N/A) - L5-S1 Anterior lumbar interbody fusion with Dr. Sherren Mocha Early for approach Left L3-4 L4-5 Anterolateral lumbar interbody fusion (Left) - Left L3-4 L4-5 Anterolateral lumbar interbody fusion L3-S1 Percutaneous pedicle screws (N/A) - L3-S1 Percutaneous pedicle screws ABDOMINAL EXPOSURE (N/A)     Discharge Exam: Blood pressure 103/69, pulse 82, temperature 98.3 F (36.8 C), temperature source Oral, resp. rate 20, weight 93.9 kg (207 lb), SpO2 97 %. Alert, conversant, walking from bathroom. No BM yet, but passing gas. No distention. BS x4. Nontender. Good strength BLE. Lumbar, side, and  abdominal incisions without erythema, swelling, or drainage beneath honeycomb & Dermabond.      Disposition: 01-Home or Self Care  Rx's Percocet and Valium for prn home use to chart. Pt verbalizes understanding of d/c instructions and agrees to call office to schedule f/u appt.       Allergies as of 05/09/2017   No Known Allergies     Medication List    STOP taking these medications   ALEVE 220 MG Caps Generic drug:  Naproxen Sodium   ALEVE 220 MG tablet Generic drug:  naproxen sodium     TAKE these medications   diazepam 5 MG tablet Commonly known as:  VALIUM Take 1 tablet (5 mg total) by mouth every 6 (six) hours as needed for muscle spasms.   gabapentin 300 MG capsule Commonly known as:  NEURONTIN Take 300 mg by mouth 3 (three) times daily as needed (pain).   methocarbamol 500 MG tablet Commonly known as:  ROBAXIN Take 1 tablet (500 mg total) by mouth every 6 (six) hours as needed for muscle spasms.   omeprazole 20 MG capsule Commonly known as:  PRILOSEC Take 20 mg by mouth daily.   oxyCODONE-acetaminophen 10-325 MG tablet Commonly known as:  PERCOCET Take 1 tablet by mouth every 4 (four) hours as needed for pain.        Signed: Verdis Prime 05/09/2017, 7:29 AM   Patient is doing well.  Discharge home.

## 2017-05-09 NOTE — Evaluation (Signed)
Occupational Therapy Evaluation Patient Details Name: Gabriel Cummings MRN: [id number] DOB: 04-21-1951 Today's Date: 05/09/2017    History of Present Illness 66 yo male s/p L5-S1 Anterior lumbar interbody fusion with Dr. Sherren Mocha Early for approach Left L3-4 L4-5 Anterolateral lumbar interbody fusion (Left) - Left L3-4 L4-5 Anterolateral lumbar interbody fusion L3-S1 Percutaneous pedicle screws (N/A) - L3-S1 Percutaneous pedicle screws   Clinical Impression   Patient evaluated by Occupational Therapy with no further acute OT needs identified. All education has been completed and the patient has no further questions. See below for any follow-up Occupational Therapy or equipment needs. OT to sign off. Thank you for referral.      Follow Up Recommendations  No OT follow up    Equipment Recommendations  3 in 1 bedside commode    Recommendations for Other Services       Precautions / Restrictions Precautions Precautions: Back Precaution Comments: handout provided and reviewed for adls  Required Braces or Orthoses: Spinal Brace Spinal Brace: Lumbar corset;Applied in sitting position Restrictions Weight Bearing Restrictions: No      Mobility Bed Mobility               General bed mobility comments: up on eob on arrival  Transfers Overall transfer level: Independent               General transfer comment: reaching for environmental supports    Balance                                           ADL either performed or assessed with clinical judgement   ADL Overall ADL's : Independent                                       General ADL Comments: pt able to cross bil LE, dressing with wife on arrival. will need 3n1 for home  Pt demonstrates walk in shower..  Pt educated on bathing and avoid washing directly on incision. Pt educated to use new wash cloth and towel each day. Pt educated to allow water to run across dressing and not to  soak in a tub at this time.   Back handout provided and reviewed adls in detail. Pt educated on: clothing between brace, never sleep in brace, set an alarm at night for medication, avoid sitting for long periods of time, correct bed positioning for sleeping, correct sequence for bed mobility, avoiding lifting more than 5 pounds and never wash directly over incision. All education is complete and patient indicates understanding.     Vision Baseline Vision/History: Wears glasses Wears Glasses: At all times       Perception     Praxis      Pertinent Vitals/Pain Pain Assessment: 0-10 Pain Score: 2  Pain Location: back Pain Descriptors / Indicators: Operative site guarding Pain Intervention(s): Monitored during session;Premedicated before session;Repositioned     Hand Dominance Right   Extremity/Trunk Assessment Upper Extremity Assessment Upper Extremity Assessment: Overall WFL for tasks assessed   Lower Extremity Assessment Lower Extremity Assessment: Defer to PT evaluation   Cervical / Trunk Assessment Cervical / Trunk Assessment: Other exceptions (s/p surg)   Communication Communication Communication: No difficulties   Cognition Arousal/Alertness: Awake/alert Behavior During Therapy: WFL for tasks assessed/performed Overall Cognitive Status: Within Functional Limits  for tasks assessed                                     General Comments       Exercises     Shoulder Instructions      Home Living Family/patient expects to be discharged to:: Private residence Living Arrangements: Spouse/significant other Available Help at Discharge: Family Type of Home: House Home Access: Stairs to enter Technical brewer of Steps: 3  Entrance Stairs-Rails: Can reach both Home Layout: One level     Bathroom Shower/Tub: Occupational psychologist: [place]: Radio producer - single point          Prior Functioning/Environment Level  of Independence: Independent                 OT Problem List:        OT Treatment/Interventions:      OT Goals(Current goals can be found in the care plan section)    OT Frequency:     Barriers to D/C:            Co-evaluation              AM-PAC PT "6 Clicks" Daily Activity     Outcome Measure Help from another person eating meals?: None Help from another person taking care of personal grooming?: None Help from another person toileting, which includes using toliet, bedpan, or urinal?: None Help from another person bathing (including washing, rinsing, drying)?: None Help from another person to put on and taking off regular upper body clothing?: None Help from another person to put on and taking off regular lower body clothing?: None 6 Click Score: 24   End of Session Equipment Utilized During Treatment: Back brace Nurse Communication: Mobility status;Precautions  Activity Tolerance: Patient tolerated treatment well Patient left: in bed;with call bell/phone within reach;with family/visitor present  OT Visit Diagnosis: Unsteadiness on feet (R26.81)                Time: 2947-6546 OT Time Calculation (min): 30 min Charges:  OT General Charges $OT Visit: 1 Procedure OT Evaluation $OT Eval Moderate Complexity: 1 Procedure OT Treatments $Self Care/Home Management : 8-22 mins G-Codes:      Jeri Modena   OTR/L Pager: [phone] Office: [phone] .   Parke Poisson B 05/09/2017, 9:25 AM

## 2017-05-09 NOTE — Progress Notes (Addendum)
Subjective: Patient reports "I don't hurt, just a little sore"  Objective: Vital signs in last 24 hours: Temp:  [96.9 F (36.1 C)-98.4 F (36.9 C)] 98.3 F (36.8 C) (07/31 0400) Pulse Rate:  [65-85] 82 (07/31 0400) Resp:  [7-20] 20 (07/31 0400) BP: (103-159)/(69-96) 103/69 (07/31 0400) SpO2:  [96 %-100 %] 97 % (07/31 0400) Arterial Line BP: (162-177)/(78-86) 163/81 (07/30 1456)  Intake/Output from previous day: 07/30 0701 - 07/31 0700 In: 2750 [I.V.:2500; IV Piggyback:250] Out: 2060 [Urine:1810; Blood:250] Intake/Output this shift: No intake/output data recorded.  Alert, conversant, walking from bathroom. No BM yet, but passing gas. No distention. BS x4. Nontender. Good strength BLE. Lumbar, side, and abdominal incisions without erythema, swelling, or drainage beneath honeycomb & Dermabond.   Lab Results: No results for input(s): WBC, HGB, HCT, PLT in the last 72 hours. BMET No results for input(s): NA, K, CL, CO2, GLUCOSE, BUN, CREATININE, CALCIUM in the last 72 hours.  Studies/Results: Dg Lumbar Spine Complete  Result Date: 05/08/2017 CLINICAL DATA:  66 year old male undergoing lumbar spine surgery. EXAM: DG C-ARM GT 120 MIN; LUMBAR SPINE - COMPLETE 4+ VIEW FLUOROSCOPY TIME:  Fluoroscopy Time:  2 minutes and 51 seconds Radiation Exposure Index (if provided by the fluoroscopic device): Number of Acquired Spot Images: 0 COMPARISON:  Lumbar MRI 02/08/2017.  Lumbar radiographs 01/09/2017. FINDINGS: Normal lumbar segmentation with full size ribs at T12 demonstrated on the 01/09/2017 radiographs, which is the same numbering system used on the 02/08/2017 lumbar MRI. Mild grade 1 anterolisthesis at L5-S1. Eight intraoperative fluoroscopic spot views of the lumbar spine. Anterior approach interbody implant placement at L5-S1 demonstrated, followed by lateral type approach interbody implants at L3-L4 and L4-L5. On the final two images bilateral posterior transpedicular hardware has been  placed from the L3 to the S1 level. IMPRESSION: L3 through S1 posterior and interbody fusion. Electronically Signed   By: Genevie Ann M.D.   On: 05/08/2017 14:15   Dg C-arm Gt 120 Min  Result Date: 05/08/2017 CLINICAL DATA:  66 year old male undergoing lumbar spine surgery. EXAM: DG C-ARM GT 120 MIN; LUMBAR SPINE - COMPLETE 4+ VIEW FLUOROSCOPY TIME:  Fluoroscopy Time:  2 minutes and 51 seconds Radiation Exposure Index (if provided by the fluoroscopic device): Number of Acquired Spot Images: 0 COMPARISON:  Lumbar MRI 02/08/2017.  Lumbar radiographs 01/09/2017. FINDINGS: Normal lumbar segmentation with full size ribs at T12 demonstrated on the 01/09/2017 radiographs, which is the same numbering system used on the 02/08/2017 lumbar MRI. Mild grade 1 anterolisthesis at L5-S1. Eight intraoperative fluoroscopic spot views of the lumbar spine. Anterior approach interbody implant placement at L5-S1 demonstrated, followed by lateral type approach interbody implants at L3-L4 and L4-L5. On the final two images bilateral posterior transpedicular hardware has been placed from the L3 to the S1 level. IMPRESSION: L3 through S1 posterior and interbody fusion. Electronically Signed   By: Genevie Ann M.D.   On: 05/08/2017 14:15   Dg Or Local Abdomen  Result Date: 05/08/2017 CLINICAL DATA:  Status post L5-S1 ALIF. No missing instruments, obtained per protocol for anterior exposure. EXAM: OR LOCAL ABDOMEN COMPARISON:  Preoperative MRI 02/08/2017 FINDINGS: There is an ALIF in place at L5-S1. Vascular clips are seen in the operative region. No unexpected foreign body or acute osseous finding. These results were called by telephone at the time of interpretation on 05/08/2017 at 10:13 am to [place], who verbally acknowledged these results. IMPRESSION: No unexpected foreign body. Electronically Signed   By: Neva Seat.D.  On: 05/08/2017 10:14    Assessment/Plan: Improving  LOS: 1 day  Per DrStern, ok to d/c to home  after BM. Rx's Percocet and Valium for prn home use to chart. Pt verbalizes understanding of d/c instructions and agrees to call office to schedule f/u appt.    Verdis Prime 05/09/2017, 7:24 AM   Patient is doing well.  Discharge home.

## 2017-05-09 NOTE — Progress Notes (Signed)
Patient ID: Gabriel Cummings, male   DOB: 1951-07-20, 66 y.o.   MRN: [id number] Doristine Devoid this morning. Comfortable with minimal discomfort. Sitting up at the bedside working with physical therapy. Will not follow actively. Please call us if we can assist

## 2017-05-09 NOTE — Evaluation (Signed)
Physical Therapy Evaluation and Discharge Patient Details Name: Gabriel Cummings MRN: [id number] DOB: [birth year] Today's Date: 05/09/2017   History of Present Illness  66 yo male s/p L5-S1 Anterior lumbar interbody fusion with Dr. Sherren Mocha Early for approach Left L3-4 L4-5 Anterolateral lumbar interbody fusion (Left) - Left L3-4 L4-5 Anterolateral lumbar interbody fusion L3-S1 Percutaneous pedicle screws (N/A) - L3-S1 Percutaneous pedicle screws  Clinical Impression  Patient evaluated by Physical Therapy with no further acute PT needs identified. All education has been completed and the patient has no further questions. At the time of PT eval pt was able to perform transfers and ambulation with gross modified independence. Pt was able to complete stair training and was educated on walking program and safety with brace maintenance. See below for any follow-up Physical Therapy or equipment needs. PT is signing off. Thank you for this referral.     Follow Up Recommendations No PT follow up;Supervision for mobility/OOB    Equipment Recommendations  None recommended by PT    Recommendations for Other Services       Precautions / Restrictions Precautions Precautions: Back Precaution Comments: handout provided and reviewed for adls  Required Braces or Orthoses: Spinal Brace Spinal Brace: Lumbar corset;Applied in sitting position Restrictions Weight Bearing Restrictions: No      Mobility  Bed Mobility               General bed mobility comments: Pt received exiting bathroom  Transfers Overall transfer level: Independent Equipment used: None             General transfer comment: reaching for environmental supports - cued for proper posture and maintenance of precautions.   Ambulation/Gait Ambulation/Gait assistance: Modified independent (Device/Increase time) Ambulation Distance (Feet): 400 Feet Assistive device: None Gait Pattern/deviations: Step-through pattern;Decreased  stride length;Trunk flexed Gait velocity: Decreased Gait velocity interpretation: Below normal speed for age/gender General Gait Details: Grossly flexed knees. Pt was able to ambulate well however appeared to fatigue at end of gait training. VC's for improved posture and general safety.   Stairs Stairs: Yes Stairs assistance: Supervision Stair Management: One rail Right;Step to pattern;Sideways Number of Stairs: 4 General stair comments: VC's for sequencing and maintenance of precautions.  Wheelchair Mobility    Modified Rankin (Stroke Patients Only)       Balance Overall balance assessment: No apparent balance deficits (not formally assessed)                                           Pertinent Vitals/Pain Pain Assessment: Faces Pain Score: 2  Faces Pain Scale: Hurts a little bit Pain Location: back Pain Descriptors / Indicators: Operative site guarding Pain Intervention(s): Limited activity within patient's tolerance;Monitored during session;Repositioned    Home Living Family/patient expects to be discharged to:: Private residence Living Arrangements: Spouse/significant other Available Help at Discharge: Family Type of Home: House Home Access: Stairs to enter Entrance Stairs-Rails: Can reach both Entrance Stairs-Number of Steps: 3  Home Layout: One level Home Equipment: Cane - single point      Prior Function Level of Independence: Independent               Hand Dominance   Dominant Hand: Right    Extremity/Trunk Assessment   Upper Extremity Assessment Upper Extremity Assessment: Defer to OT evaluation    Lower Extremity Assessment Lower Extremity Assessment: Generalized weakness (Consistent with pre-op diagnosis)  Cervical / Trunk Assessment Cervical / Trunk Assessment: Other exceptions (s/p surg)  Communication   Communication: No difficulties  Cognition Arousal/Alertness: Awake/alert Behavior During Therapy: WFL for tasks  assessed/performed Overall Cognitive Status: Within Functional Limits for tasks assessed                                        General Comments      Exercises     Assessment/Plan    PT Assessment Patent does not need any further PT services  PT Problem List         PT Treatment Interventions      PT Goals (Current goals can be found in the Care Plan section)  Acute Rehab PT Goals Patient Stated Goal: home today PT Goal Formulation: With patient/family Time For Goal Achievement: 05/16/17 Potential to Achieve Goals: Good    Frequency     Barriers to discharge        Co-evaluation               AM-PAC PT "6 Clicks" Daily Activity  Outcome Measure Difficulty turning over in bed (including adjusting bedclothes, sheets and blankets)?: None Difficulty moving from lying on back to sitting on the side of the bed? : None Difficulty sitting down on and standing up from a chair with arms (e.g., wheelchair, bedside commode, etc,.)?: None Help needed moving to and from a bed to chair (including a wheelchair)?: None Help needed walking in hospital room?: None Help needed climbing 3-5 steps with a railing? : None 6 Click Score: 24    End of Session Equipment Utilized During Treatment: Back brace Activity Tolerance: Patient tolerated treatment well Patient left: in bed;with call bell/phone within reach;with family/visitor present Nurse Communication: Mobility status PT Visit Diagnosis: Pain;Other symptoms and signs involving the nervous system (R29.898) Pain - part of body:  (back)    Time: 3267-1245 PT Time Calculation (min) (ACUTE ONLY): 16 min   Charges:   PT Evaluation $PT Eval Moderate Complexity: 1 Mod     PT G Codes:        Rolinda Roan, PT, DPT Acute Rehabilitation Services Pager: [phone]   Thelma Comp 05/09/2017, 9:55 AM

## 2017-05-09 NOTE — Discharge Instructions (Signed)

## 2017-05-09 NOTE — Progress Notes (Signed)
Patient alert and oriented, mae's well, voiding adequate amount of urine, swallowing without difficulty, no c/o pain at time of discharge, only soreness.  Patient discharged home with family. Script and discharged instructions given to patient. Patient and family stated understanding of instructions given. Patient has an appointment with Dr. Vertell Limber.

## 2017-08-31 IMAGING — RF DG LUMBAR SPINE COMPLETE 4+V
1 series · 8 of 8 positions shown · non-contrast
Comparison: Lumbar MRI 02/08/2017.  Lumbar radiographs 01/09/2017.

CLINICAL DATA: 66-year-old male undergoing lumbar spine surgery.

EXAM:
DG C-ARM GT 120 MIN; LUMBAR SPINE - COMPLETE 4+ VIEW
FLUOROSCOPY TIME:  Fluoroscopy Time:  2 minutes and 51 seconds
Radiation Exposure Index (if provided by the fluoroscopic device):
Number of Acquired Spot Images: 0

[Series 1: run · 8 of 8 slices shown]
[im 1/8]
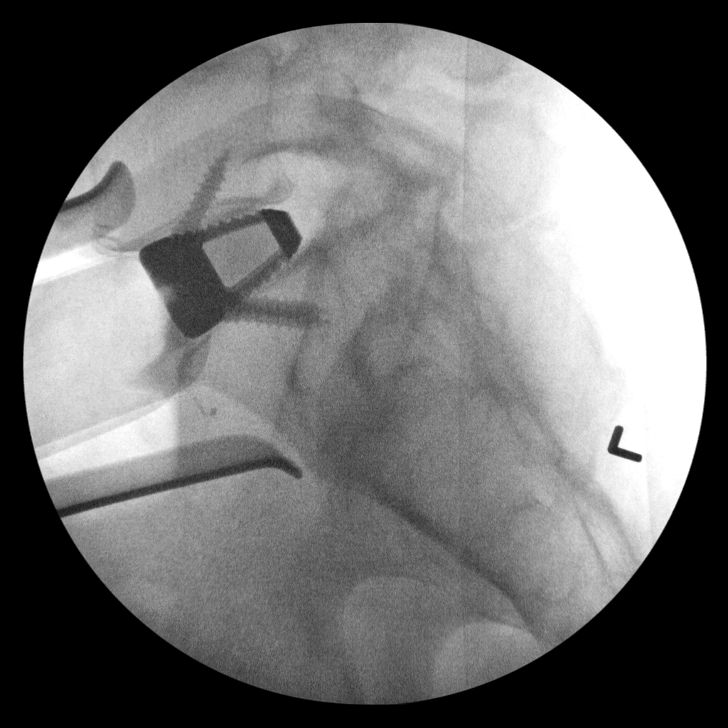
[im 2/8]
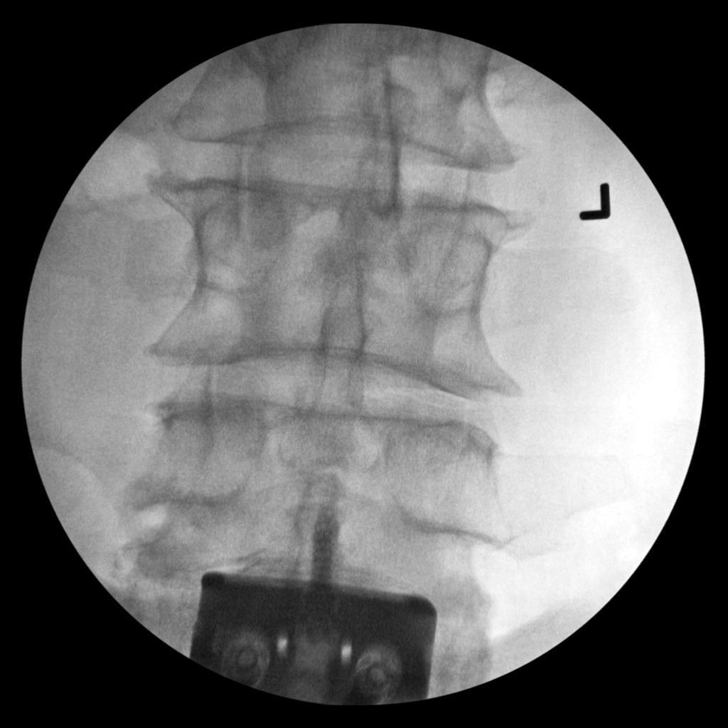
[im 3/8]
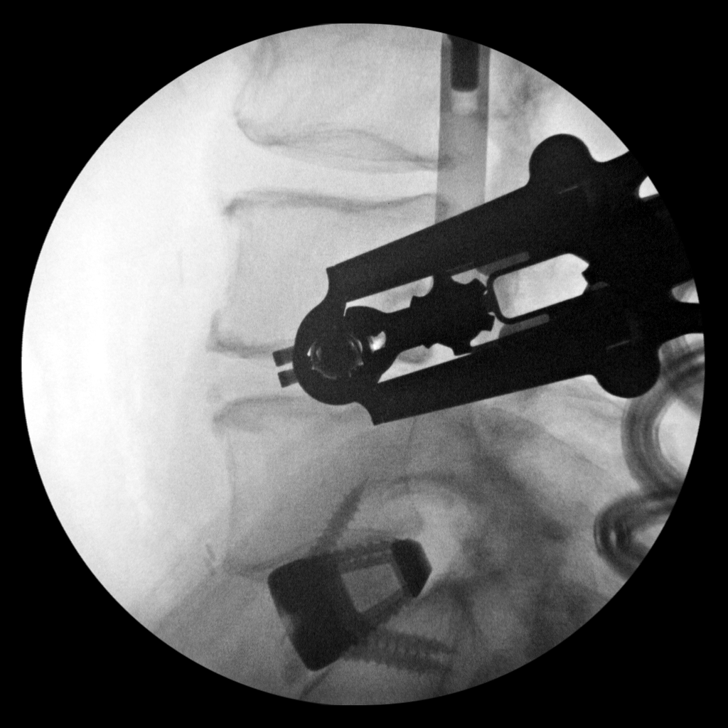
[im 4/8]
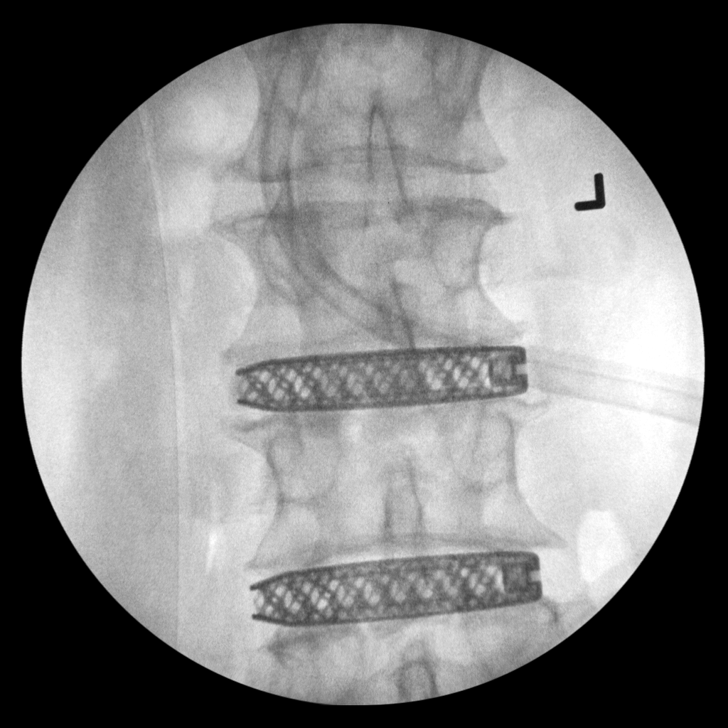
[im 5/8]
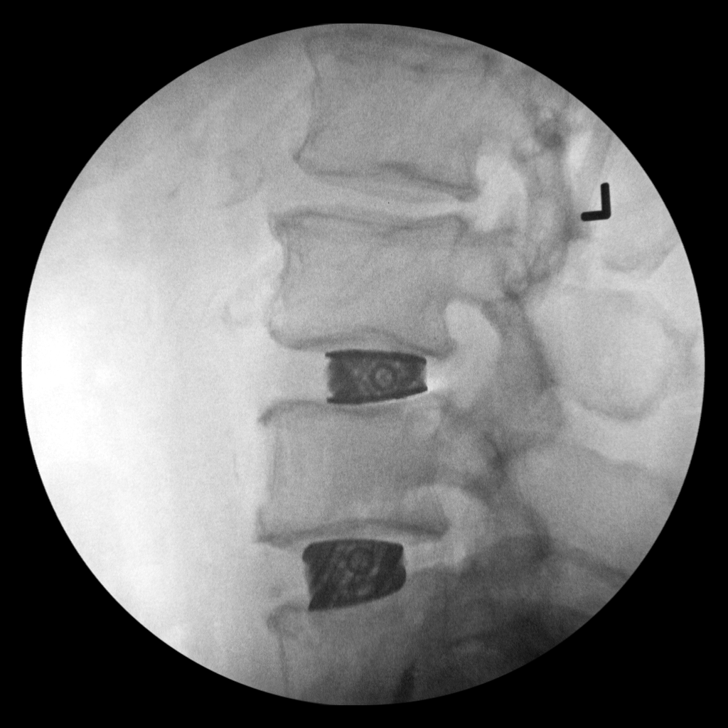
[im 6/8]
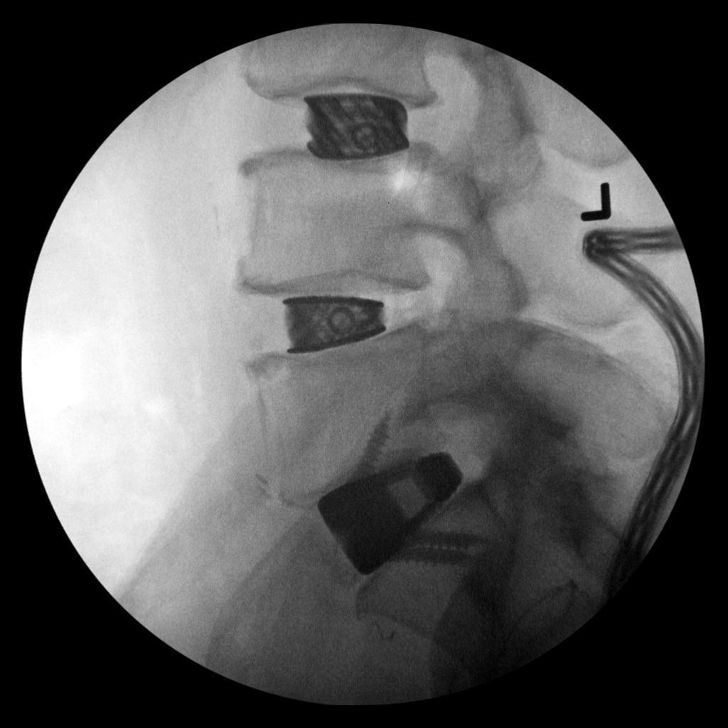
[im 7/8]
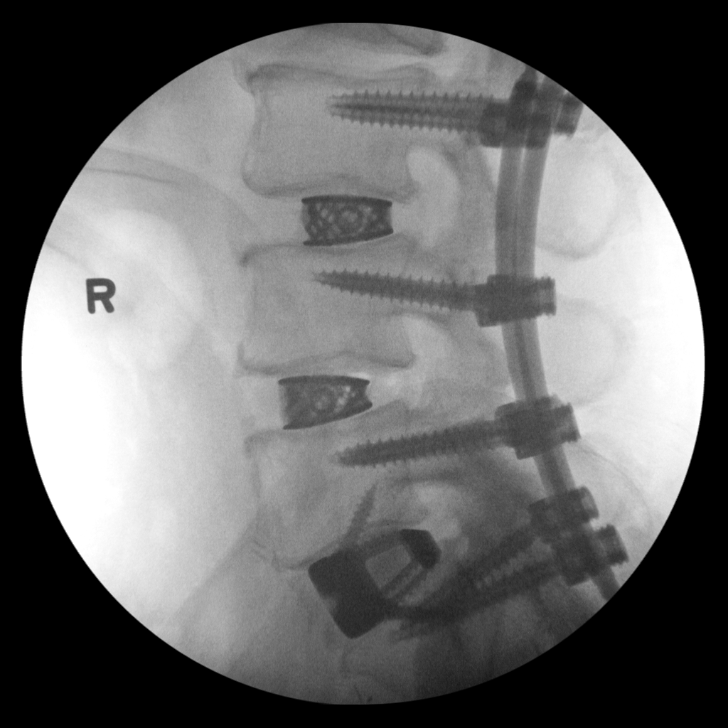
[im 8/8]
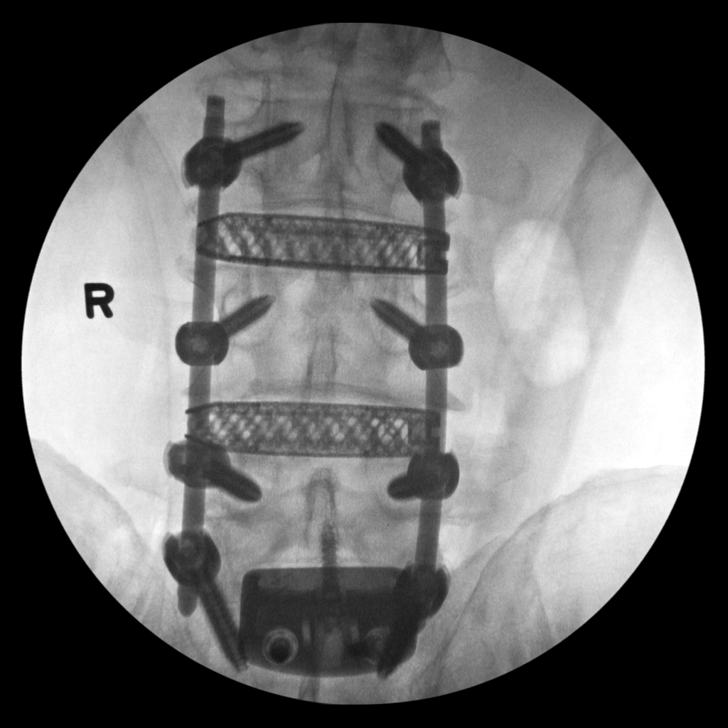

[8 of 8 positions shown; findings below may reference images not displayed]

FINDINGS: Normal lumbar segmentation with full size ribs at T12 demonstrated
on the 01/09/2017 radiographs, which is the same numbering system
used on the 02/08/2017 lumbar MRI. Mild grade 1 anterolisthesis at
L5-S1.

Eight intraoperative fluoroscopic spot views of the lumbar spine.
Anterior approach interbody implant placement at L5-S1 demonstrated,
followed by lateral type approach interbody implants at L3-L4 and
L4-L5. On the final two images bilateral posterior transpedicular
hardware has been placed from the L3 to the S1 level.
IMPRESSION: L3 through S1 posterior and interbody fusion.

## 2023-11-28 ENCOUNTER — Ambulatory Visit: Payer: Medicare Other | Admitting: Endocrinology

## 2023-11-28 ENCOUNTER — Other Ambulatory Visit: Payer: Self-pay | Admitting: Endocrinology

## 2023-11-28 ENCOUNTER — Encounter: Payer: Self-pay | Admitting: Endocrinology

## 2023-11-28 VITALS — BP 132/82 | HR 72 | Resp 20 | Ht 71.0 in | Wt 204.1 lb

## 2023-11-28 DIAGNOSIS — Z7984 Long term (current) use of oral hypoglycemic drugs: Secondary | ICD-10-CM | POA: Diagnosis not present

## 2023-11-28 DIAGNOSIS — E1169 Type 2 diabetes mellitus with other specified complication: Secondary | ICD-10-CM

## 2023-11-28 DIAGNOSIS — E1165 Type 2 diabetes mellitus with hyperglycemia: Secondary | ICD-10-CM

## 2023-11-28 LAB — BASIC METABOLIC PANEL WITH GFR
BUN: 18 mg/dL (ref 7–25)
CO2: 26 mmol/L (ref 20–32)
Calcium: 9.7 mg/dL (ref 8.6–10.3)
Chloride: 102 mmol/L (ref 98–110)
Creat: 0.83 mg/dL (ref 0.70–1.28)
Glucose, Bld: 150 mg/dL — ABNORMAL HIGH (ref 65–99)
Potassium: 4.5 mmol/L (ref 3.5–5.3)
Sodium: 137 mmol/L (ref 135–146)
eGFR: 92 mL/min/{1.73_m2} (ref >=60)

## 2023-11-28 LAB — LIPID PANEL
Cholesterol: 161 mg/dL (ref <200)
HDL: 55 mg/dL (ref >=40)
LDL Cholesterol (Calc): 86 mg/dL
Non-HDL Cholesterol (Calc): 106 mg/dL (ref <130)
Total CHOL/HDL Ratio: 2.9 (calc) (ref <5.0)
Triglycerides: 104 mg/dL (ref <150)

## 2023-11-28 LAB — POCT GLYCOSYLATED HEMOGLOBIN (HGB A1C): Hemoglobin A1C: 7.1 % — AB (ref 4.0–5.6)

## 2023-11-28 MED ORDER — METFORMIN HCL ER 500 MG PO TB24: 500.0000 mg | ORAL_TABLET | Freq: Every day | ORAL | 2 refills | Status: DC

## 2023-11-28 MED ORDER — SITAGLIPTIN PHOSPHATE 50 MG PO TABS: 50.0000 mg | ORAL_TABLET | Freq: Every day | ORAL | 1 refills | Status: DC

## 2023-11-28 NOTE — Patient Instructions (Signed)
 Diabetes regimen  Decrease metformin extended release to 1 tablet daily.  If you continue to have diarrhea after 1 to 2 weeks to stop completely.  Start Januvia 50 mg daily.  I have referred to dietitian.

## 2023-11-28 NOTE — Progress Notes (Signed)
 Outpatient Endocrinology Note Gabriel Kimmi Acocella, MD   Patient's Name: Gabriel Cummings    DOB: 1951-05-11    MRN: [id number]                                                    REASON OF VISIT: New consult  for type 2 diabetes mellitus  REFERRING PROVIDER: Tacy Learn, FNP  PCP: Erasmo Downer, NP  HISTORY OF PRESENT ILLNESS:   Gabriel Cummings is a 73 y.o. old male with past medical history listed below, is here for new consult for type 2 diabetes mellitus.   Pertinent Diabetes History: Patient was diagnosed with type 2 diabetes mellitus around 2010/2011 timeframe.  Patient had side effects with some antidiabetic medication.  Patient is referred to endocrinology for further evaluation and management of type 2 diabetes mellitus.  Previous diabetes education: no  Family h/o diabetes mellitus: Multiple family members with type 2 diabetes mellitus.  Hemoglobin A1c was 7.2% in October 2024.  Referral medical records reviewed.  Chronic Diabetes Complications : Retinopathy: no. Last ophthalmology exam was done on annually, following with ophthalmology regularly.  Nephropathy: no, on ACE/ARB / none Peripheral neuropathy: yes, history of spinal surgery in 2018.  No longer taking gabapentin. Coronary artery disease: no Stroke: no  Relevant comorbidities and cardiovascular risk factors: Obesity: no Body mass index is 28.47 kg/m.  Hypertension: Yes  Hyperlipidemia : Yes, not on statin  Patient refuses statin and ACE/ARB therapy.   Current / Home Diabetic regimen includes:  Metformin ER 500 mg 1 tablets two times a day.  Prior diabetic medications: He had taken higher dose of metformin, Rybelsus and Jardiance in the past.  Diarrhea with higher dose of metformin. Rybelsus caused nausea.  Jardiance stopped due to not feeling good.  Glycemic data:   He has been checking blood sugar mostly daily in the morning fasting.  Forgot to bring glucometer in the clinic today.  Some of the  blood sugar he recalls as follows.  -  Trends noted: 135, 135, 205, 185,    Hypoglycemia: Patient has no hypoglycemic episodes. Patient has hypoglycemia awareness.  Factors modifying glucose control: 1.  Diabetic diet assessment: 3 meals, cereals, corn, fruits, meats, breads.   2.  Staying active or exercising: gym, 2 x a week, active at farm.   3.  Medication compliance: compliant all of the time.  Interval history  Hemoglobin A1c today 7.1%.  It was 7.2% in October.  Patient has been taking metformin extended release 1 tablet 2 times a day.  He stopped taking metformin and Jardiance in the past due to side effect.  He had diarrhea with higher dose of metformin.  He also complains of joint pain.  He continued to have diarrhea multiple times a day even with current dose of metformin.  He denies numbness and tingling of the feet.  No other complaints today.  REVIEW OF SYSTEMS As per history of present illness.   PAST MEDICAL HISTORY: Past Medical History:  Diagnosis Date   Arthritis    Diabetes mellitus without complication (HCC)    h/o elevated A1C, however do not take anything for it 05/05/17   GERD (gastroesophageal reflux disease)    History of hiatal hernia    PUD (peptic ulcer disease)    Reflux esophagitis    Scoliosis (and  kyphoscoliosis), idiopathic     PAST SURGICAL HISTORY: Past Surgical History:  Procedure Laterality Date   ABDOMINAL EXPOSURE N/A 05/08/2017   Procedure: ABDOMINAL EXPOSURE;  Surgeon: Larina Earthly, MD;  Location: [facility] OR;  Service: Vascular;  Laterality: N/A;   ANTERIOR LAT LUMBAR FUSION Left 05/08/2017   Procedure: Left L3-4 L4-5 Anterolateral lumbar interbody fusion;  Surgeon: Maeola Harman, MD;  Location: [facility] OR;  Service: Neurosurgery;  Laterality: Left;  Left L3-4 L4-5 Anterolateral lumbar interbody fusion   ANTERIOR LUMBAR FUSION N/A 05/08/2017   Procedure: L5-S1 Anterior lumbar interbody fusion with Dr. Tawanna Cooler Early for approach;  Surgeon: Maeola Harman, MD;  Location[facility] OR;  Service: Neurosurgery;  Laterality: N/A;  L5-S1 Anterior lumbar interbody fusion with Dr. Tawanna Cooler Early for approach   CHOLECYSTECTOMY     COLONOSCOPY WITH PROPOFOL N/A 03/13/2017   Procedure: COLONOSCOPY WITH PROPOFOL;  Surgeon: Scot Jun, MD;  Location: [facility] ENDOSCOPY;  Service: Endoscopy;  Laterality: N/A;   HERNIA REPAIR  05/2014   LUMBAR PERCUTANEOUS PEDICLE SCREW 3 LEVEL N/A 05/08/2017   Procedure: L3-S1 Percutaneous pedicle screws;  Surgeon: Maeola Harman, MD;  Location: [facility] OR;  Service: Neurosurgery;  Laterality: N/A;  L3-S1 Percutaneous pedicle screws   MENISCUS REPAIR Left 1987   ROTATOR CUFF REPAIR      ALLERGIES: No Known Allergies  FAMILY HISTORY:  History reviewed. No pertinent family history.  SOCIAL HISTORY: Social History   Socioeconomic History   Marital status: Married    Spouse name: Not on file   Number of children: Not on file   Years of education: Not on file   Highest education level: Not on file  Occupational History   Not on file  Tobacco Use   Smoking status: Some Days    Types: Cigars   Smokeless tobacco: Former   Tobacco comments:    good cigar every once in a while  Vaping Use   Vaping status: Never Used  Substance and Sexual Activity   Alcohol use: Yes    Alcohol/week: 2.0 standard drinks of alcohol    Types: 2 Cans of beer per week   Drug use: No   Sexual activity: Not on file  Other Topics Concern   Not on file  Social History Narrative   Not on file   Social Drivers of Health   Financial Resource Strain: Not on file  Food Insecurity: Not on file  Transportation Needs: Not on file  Physical Activity: Not on file  Stress: Not on file  Social Connections: Not on file    MEDICATIONS:  Current Outpatient Medications  Medication Sig Dispense Refill   metFORMIN (GLUCOPHAGE-XR) 500 MG 24 hr tablet Take 1 tablet (500 mg total) by mouth daily with breakfast. 90 tablet 2   omeprazole (PRILOSEC) 20 MG  capsule Take 20 mg by mouth daily.     sitaGLIPtin (JANUVIA) 50 MG tablet Take 1 tablet (50 mg total) by mouth daily. 90 tablet 1   diazepam (VALIUM) 5 MG tablet Take 1 tablet (5 mg total) by mouth every 6 (six) hours as needed for muscle spasms. (Patient not taking: Reported on 11/28/2023) 30 tablet 0   gabapentin (NEURONTIN) 300 MG capsule Take 300 mg by mouth 3 (three) times daily as needed (pain). (Patient not taking: Reported on 11/28/2023)     methocarbamol (ROBAXIN) 500 MG tablet Take 1 tablet (500 mg total) by mouth every 6 (six) hours as needed for muscle spasms. (Patient not taking: Reported on 11/28/2023) 60  tablet 1   oxyCODONE-acetaminophen (PERCOCET) 10-325 MG tablet Take 1 tablet by mouth every 4 (four) hours as needed for pain. (Patient not taking: Reported on 11/28/2023) 60 tablet 0   No current facility-administered medications for this visit.    PHYSICAL EXAM: Vitals:   11/28/23 0922  BP: 132/82  Pulse: 72  Resp: 20  SpO2: 96%  Weight: 204 lb 1.6 oz (92.6 kg)  Height: 5\' 11"  (1.803 m)   Body mass index is 28.47 kg/m.  Wt Readings from Last 3 Encounters:  11/28/23 204 lb 1.6 oz (92.6 kg)  05/08/17 207 lb (93.9 kg)  05/05/17 207 lb 8 oz (94.1 kg)    General: Well developed, well nourished male in no apparent distress.  HEENT: AT/[place], no external lesions.  Eyes: Conjunctiva clear and no icterus. Neck: Neck supple  Lungs: Respirations not labored Neurologic: Alert, oriented, normal speech Extremities / Skin: Dry. No sores or rashes noted. Psychiatric: Does not appear depressed or anxious  Diabetic Foot Exam - Simple   Simple Foot Form Diabetic Foot exam was performed with the following findings: Yes 11/28/2023  9:55 AM  Visual Inspection No deformities, no ulcerations, no other skin breakdown bilaterally: Yes Sensation Testing See comments: Yes Pulse Check Posterior Tibialis and Dorsalis pulse intact bilaterally: Yes Comments Right foot absent on right foot and  significant absent on left foot.      LABS Reviewed Lab Results  Component Value Date   HGBA1C 7.1 (A) 11/28/2023   HGBA1C 6.6 (H) 05/05/2017   No results found for: "FRUCTOSAMINE" No results found for: "CHOL", "HDL", "LDLCALC", "LDLDIRECT", "TRIG", "CHOLHDL" No results found for: "MICRALBCREAT" Lab Results  Component Value Date   CREATININE 0.93 05/05/2017   No results found for: "GFR"  ASSESSMENT / PLAN  1. Type 2 diabetes mellitus with hyperglycemia, without long-term current use of insulin (HCC)   2. Type 2 diabetes mellitus with other specified complication, without long-term current use of insulin (HCC)     Diabetes Mellitus type 2, complicated by ?  Diabetic neuropathy. - Diabetic status / severity: Fair control.  Lab Results  Component Value Date   HGBA1C 7.1 (A) 11/28/2023    - Hemoglobin A1c goal : <7%  Discussed about type 2 diabetes mellitus and potential chronic complications including diabetic retinopathy, neuropathy and nephropathy.  - Medications: See below.  I) decrease metformin 500 mg extended release 1 tablet 2 times a day to 1 time a day.  If he continues to have diarrhea even 1 to 2 weeks after decreasing dose after he stop metformin completely. II) start Januvia 50 mg daily.  - Home glucose testing: The morning fasting.  After being glucometer in the follow-up visit. - Discussed/ Gave Hypoglycemia treatment plan.  # Consult : Refer to diabetes educator/dietitian, patient preferred to have visit around [place] area.   # Annual urine for microalbuminuria/ creatinine ratio, no microalbuminuria currently. Last No results found for: "MICRALBCREAT"  Microalbumin creatinine ratio in October 2024 was 13.8, normal.  # Foot check nightly / neuropathy.  History of spinal surgery.  # Annual dilated diabetic eye exams.   - Diet: Make healthy diabetic food choices, discussed in detail about portion control and limiting carbohydrates breads, fruits  etc. - Life style / activity / exercise: Discussed.  2. Blood pressure  -  BP Readings from Last 1 Encounters:  11/28/23 132/82    - Control is in target.  - No change in current plans.  3. Lipid status / Hyperlipidemia -  Last No results found for: "LDLCALC" LDL 81 in July 2024. -Currently not on any statin, will consider in the future visit to start low-dose of a statin.  Patient agreed.  Diagnoses and all orders for this visit:  Type 2 diabetes mellitus with hyperglycemia, without long-term current use of insulin (HCC) -     POCT glycosylated hemoglobin (Hb A1C)  Type 2 diabetes mellitus with other specified complication, without long-term current use of insulin (HCC) -     sitaGLIPtin (JANUVIA) 50 MG tablet; Take 1 tablet (50 mg total) by mouth daily. -     BASIC METABOLIC PANEL WITH GFR -     Lipid panel -     metFORMIN (GLUCOPHAGE-XR) 500 MG 24 hr tablet; Take 1 tablet (500 mg total) by mouth daily with breakfast. -     Amb Referral to Nutrition and Diabetic Education    DISPOSITION Follow up in clinic in 3  months suggested.   All questions answered and patient verbalized understanding of the plan.  Gabriel Avenell Sellers, MD [facility] Endocrinology [facility] [address] [place], [address] [place], [place] [address] Phone # [phone]  At least part of this note was generated using voice recognition software. Inadvertent word errors may have occurred, which were not recognized during the proofreading process.

## 2023-11-30 ENCOUNTER — Encounter: Payer: Self-pay | Admitting: Endocrinology

## 2024-01-16 ENCOUNTER — Encounter: Payer: Self-pay | Admitting: Dietician

## 2024-01-16 ENCOUNTER — Encounter: Payer: Medicare Other | Attending: Endocrinology | Admitting: Dietician

## 2024-01-16 DIAGNOSIS — E119 Type 2 diabetes mellitus without complications: Secondary | ICD-10-CM

## 2024-01-16 NOTE — Patient Instructions (Addendum)
 Take your metformin AFTER your breakfast meal on a full stomach!  Switch your milk to Fairlife 2%!!  Check your blood sugar each morning before eating or drinking (fasting). Look for numbers under 130 mg/dL Check your blood sugar 2 hours after you begin eating a meal. Look for numbers under 180 mg/dL at all times.  Your goal A1c is below 7.0%  Begin to recognize carbohydrates, proteins, and non-starchy vegetables in your food choices!  Begin to build your meals using the proportions of the Balanced Plate. First, select your carb choice(s) for the meal. Make this 25% of your meal. Next, select your source of protein to pair with your carb choice(s). Make this another 25% of your meal. Choose LEAN proteins!! Finally, complete your meal with a variety of non-starchy vegetables. Make this the remaining 50% of your meal.

## 2024-01-16 NOTE — Progress Notes (Signed)
 Diabetes Self-Management Education  Visit Type: First/Initial  Appt. Start Time: 1400 Appt. End Time: 1520  01/16/2024  [name], identified by name and date of birth, is a 73 y.o. male with a diagnosis of Diabetes: Type 2.   ASSESSMENT Pt reports history of fluctuations in A1c, typically around 7%, no significant increases in A1c since diagnosis 10 years ago. Pt reports checking BG at least once daily, if FBG is >200 mg/dL, they will check that afternoon, states usual FBG is 130-140 mg/dL. Pt reports history of diarrhea when taking metformin, recently dropped to 500 mg every day and started on Januvia. Pt reports improvement in symptoms over the last month since medication change. Pt reports liking to eat breakfast, doesn't like to lunch much, usually a sandwich, and will have whatever they can for dinner meals. 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 Pt reports drinking a few glasses of wine per week. 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   Diabetes Self-Management Education - 01/16/24 1415       Visit Information   Visit Type First/Initial      Initial Visit   Diabetes Type Type 2    Date Diagnosed 2015    Are you currently following a meal plan? No    Are you taking your medications as prescribed? Yes      Health Coping   How would you rate your overall health? Good      Psychosocial Assessment   Patient Belief/Attitude about Diabetes Motivated to manage diabetes    What is the hardest part about your diabetes right now, causing you the most concern, or is the most worrisome to you about your diabetes?   Making healty food and beverage choices;Getting support / problem  solving    Self-care barriers None    Self-management support Doctor's office    Other persons present Patient    Patient Concerns Nutrition/Meal planning;Glycemic Control    Special Needs None    Preferred Learning Style No preference indicated    Learning Readiness Ready    How often do you need to have someone help you when you read instructions, pamphlets, or other written materials from your doctor or pharmacy? 1 - Never    What is the last grade level you completed in school? High School Graduate      Pre-Education Assessment   Patient understands the diabetes disease and treatment process. Needs Instruction    Patient understands incorporating nutritional management into lifestyle. Needs Instruction    Patient undertands incorporating physical activity into lifestyle. Needs Instruction    Patient understands using medications safely. Needs Instruction    Patient understands monitoring blood glucose, interpreting and using results Needs Instruction    Patient understands prevention, detection, and treatment of acute complications. Needs Instruction    Patient understands prevention, detection, and treatment of chronic complications. Needs Instruction    Patient understands how to develop strategies to address psychosocial issues. Needs Instruction    Patient understands how to develop strategies to promote health/change behavior. Needs Instruction      Complications   Last HgB A1C per patient/outside source 7.1 %   11/28/2023   How often do you check your blood sugar?  1-2 times/day    Fasting Blood glucose range (mg/dL) 161-096;04-540    Postprandial Blood glucose range (mg/dL) 981-191    Have you had a dilated eye exam in the past 12 months? Yes    Have you had a dental exam in the past 12 months? Yes    Are you checking your feet? Yes    How many days per week are you checking your feet? 7      Dietary Intake   Breakfast Waffle, starwberries, blueberries, Coffee w/ cream,  Cereal (cheerios), whole milk, 1 strip of bacon    Lunch PB sandwich on whole wheat, oatmeal cookie    Dinner Pot roast, carrots, onions, potatoes, 1 biscuit, water    Snack (evening) Apple, small bite of honey bun    Beverage(s) Water, coffee      Activity / Exercise   Activity / Exercise Type ADL's;Light (walking / raking leaves)    How many days per week do you exercise? 4    How many minutes per day do you exercise? 25    Total minutes per week of exercise 100      Patient Education   Previous Diabetes Education No    Disease Pathophysiology Explored patient's options for treatment of their diabetes;Factors that contribute to the development of diabetes    Healthy Eating Role of diet in the treatment of diabetes and the relationship between the three main macronutrients and blood glucose level;Carbohydrate counting;Plate Method;Effects of alcohol on blood glucose and safety factors with consumption of alcohol.    Being Active Helped patient identify appropriate exercises in relation to his/her diabetes, diabetes complications and other health issue.    Medications Reviewed patients medication for diabetes, action, purpose, timing of dose and side effects.    Monitoring Identified appropriate SMBG and/or A1C goals.    Chronic complications Relationship between chronic complications and blood glucose control;Assessed and discussed foot care and prevention of foot problems    Diabetes Stress and Support Identified and addressed patients feelings and concerns about diabetes      Individualized Goals (developed by patient)   Nutrition Follow meal plan discussed;General guidelines for healthy choices and portions discussed    Medications take my medication as prescribed    Monitoring  Test my blood glucose as discussed    Problem Solving Eating Pattern    Reducing Risk examine blood glucose patterns      Post-Education Assessment   Patient understands the diabetes disease and treatment  process. Comprehends key points    Patient understands incorporating nutritional management into lifestyle. Comprehends key points    Patient undertands incorporating physical activity into lifestyle. Comprehends key points   Limited r/t back/leg pain   Patient understands using medications safely. Demonstrates understanding / competency    Patient understands monitoring blood glucose, interpreting and using results Comprehends key points    Patient understands prevention, detection, and treatment of acute complications. N/A    Patient understands prevention, detection, and treatment of chronic complications. Comprehends key points    Patient understands how to develop strategies to address psychosocial issues. Comprehends key points    Patient understands how to develop strategies to promote health/change behavior. Comprehends key points      Outcomes   Expected Outcomes Demonstrated interest in learning. Expect positive outcomes    Future DMSE PRN    Program Status Not Completed             Individualized Plan for Diabetes Self-Management Training:   Learning  Objective:  Patient will have a greater understanding of diabetes self-management. Patient education plan is to attend individual and/or group sessions per assessed needs and concerns.   Plan:   Patient Instructions  Take your metformin AFTER your breakfast meal on a full stomach!  Switch your milk to Fairlife 2%!!  Check your blood sugar each morning before eating or drinking (fasting). Look for numbers under 130 mg/dL Check your blood sugar 2 hours after you begin eating a meal. Look for numbers under 180 mg/dL at all times.  Your goal A1c is below 7.0%  Begin to recognize carbohydrates, proteins, and non-starchy vegetables in your food choices!  Begin to build your meals using the proportions of the Balanced Plate. First, select your carb choice(s) for the meal. Make this 25% of your meal. Next, select your  source of protein to pair with your carb choice(s). Make this another 25% of your meal. Choose LEAN proteins!! Finally, complete your meal with a variety of non-starchy vegetables. Make this the remaining 50% of your meal.   Expected Outcomes:  Demonstrated interest in learning. Expect positive outcomes  Education material provided: My Plate, Protein Foods List  If problems or questions, patient to contact team via:  Phone and Email  Future DSME appointment: PRN

## 2024-02-27 ENCOUNTER — Ambulatory Visit (INDEPENDENT_AMBULATORY_CARE_PROVIDER_SITE_OTHER): Payer: Medicare Other | Admitting: Endocrinology

## 2024-02-27 ENCOUNTER — Encounter: Payer: Self-pay | Admitting: Endocrinology

## 2024-02-27 ENCOUNTER — Ambulatory Visit: Payer: Self-pay | Admitting: Endocrinology

## 2024-02-27 VITALS — BP 130/80 | HR 86 | Ht 71.0 in | Wt 199.0 lb

## 2024-02-27 DIAGNOSIS — Z7984 Long term (current) use of oral hypoglycemic drugs: Secondary | ICD-10-CM | POA: Diagnosis not present

## 2024-02-27 DIAGNOSIS — E1165 Type 2 diabetes mellitus with hyperglycemia: Secondary | ICD-10-CM

## 2024-02-27 LAB — POCT GLYCOSYLATED HEMOGLOBIN (HGB A1C): Hemoglobin A1C: 6.6 % — AB (ref 4.0–5.6)

## 2024-02-27 NOTE — Patient Instructions (Signed)
 Latest Reference Range & Units 11/28/23 09:28 02/27/24 09:28  Hemoglobin A1C 4.0 - 5.6 % 7.1 ! Pend 6.6 !  !: Data is abnormal

## 2024-02-27 NOTE — Progress Notes (Signed)
 Outpatient Endocrinology Note [place] Redonna Wilbert, MD   Patient's Name: Gabriel Cummings    DOB: 08/21/1951    MRN: [id number]                                                    REASON OF VISIT: Follow-up for type 2 diabetes mellitus  REFERRING PROVIDER: Joette Mustard, FNP  PCP: Jonah Negus, NP  HISTORY OF PRESENT ILLNESS:   Gabriel Cummings is a 73 y.o. old male with past medical history listed below, is here for follow-up for type 2 diabetes mellitus.   Pertinent Diabetes History: Patient was diagnosed with type 2 diabetes mellitus around 2010/2011 timeframe.  Patient had side effects with some antidiabetic medication.  Patient was referred to endocrinology for further evaluation and management of type 2 diabetes mellitus, initial consult in February 2025.  Previous diabetes education: no  Family h/o diabetes mellitus: Multiple family members with type 2 diabetes mellitus.  Hemoglobin A1c was 7.2% in October 2024.  Referral medical records reviewed.  Chronic Diabetes Complications : Retinopathy: no. Last ophthalmology exam was done on annually, following with ophthalmology regularly.  Nephropathy: no, on ACE/ARB / none Peripheral neuropathy: yes, history of spinal surgery in 2018.  No longer taking gabapentin . Coronary artery disease: no Stroke: no  Relevant comorbidities and cardiovascular risk factors: Obesity: no Body mass index is 27.75 kg/m.  Hypertension: Yes  Hyperlipidemia : Yes, not on statin  Patient refuses statin and ACE/ARB therapy.   Current / Home Diabetic regimen includes:  Metformin  ER 500 mg 1 tablet daily. Januvia  50 mg daily.  Prior diabetic medications: He had taken higher dose of metformin , Rybelsus and Jardiance in the past.  Diarrhea with higher dose of metformin . Rybelsus caused nausea.  Jardiance stopped due to not feeling good.  Glycemic data:   Glucose log reviewed, mostly fasting blood sugar 120-140 range and occasionally up to 160  and 170.    Hypoglycemia: Patient has no hypoglycemic episodes. Patient has hypoglycemia awareness.  Factors modifying glucose control: 1.  Diabetic diet assessment: 3 meals, cereals, corn, fruits, meats, breads.   2.  Staying active or exercising: gym, 2 x a week, active at farm.   3.  Medication compliance: compliant all of the time.  Interval history  Hemoglobin A1c improved to 6.6%.  His diarrhea has improved after decreasing the dose of metformin , he is taking 1 tablet daily of metformin .  He is taking Januvia .  Denies numbness and ting of the feet.  No vision problem.  No other complaints today.  REVIEW OF SYSTEMS As per history of present illness.   PAST MEDICAL HISTORY: Past Medical History:  Diagnosis Date   Arthritis    Diabetes mellitus without complication (HCC)    h/o elevated A1C, however do not take anything for it 05/05/17   GERD (gastroesophageal reflux disease)    History of hiatal hernia    PUD (peptic ulcer disease)    Reflux esophagitis    Scoliosis (and kyphoscoliosis), idiopathic     PAST SURGICAL HISTORY: Past Surgical History:  Procedure Laterality Date   ABDOMINAL EXPOSURE N/A 05/08/2017   Procedure: ABDOMINAL EXPOSURE;  Surgeon: Mayo Speck, MD;  Location: [facility] OR;  Service: Vascular;  Laterality: N/A;   ANTERIOR LAT LUMBAR FUSION Left 05/08/2017   Procedure: Left L3-4 L4-5 Anterolateral lumbar  interbody fusion;  Surgeon: Manya Sells, MD;  Location: [facility] OR;  Service: Neurosurgery;  Laterality: Left;  Left L3-4 L4-5 Anterolateral lumbar interbody fusion   ANTERIOR LUMBAR FUSION N/A 05/08/2017   Procedure: L5-S1 Anterior lumbar interbody fusion with Dr. Ena Harries Early for approach;  Surgeon: Manya Sells, MD;  Location: [facility] OR;  Service: Neurosurgery;  Laterality: N/A;  L5-S1 Anterior lumbar interbody fusion with Dr. Ena Harries Early for approach   CHOLECYSTECTOMY     COLONOSCOPY WITH PROPOFOL  N/A 03/13/2017   Procedure: COLONOSCOPY WITH PROPOFOL ;  Surgeon:  Cassie Click, MD;  Location: [facility] ENDOSCOPY;  Service: Endoscopy;  Laterality: N/A;   HERNIA REPAIR  05/2014   LUMBAR PERCUTANEOUS PEDICLE SCREW 3 LEVEL N/A 05/08/2017   Procedure: L3-S1 Percutaneous pedicle screws;  Surgeon: Manya Sells, MD;  Location: [facility] OR;  Service: Neurosurgery;  Laterality: N/A;  L3-S1 Percutaneous pedicle screws   MENISCUS REPAIR Left 1987   ROTATOR CUFF REPAIR      ALLERGIES: No Known Allergies  FAMILY HISTORY:  History reviewed. No pertinent family history.  SOCIAL HISTORY: Social History   Socioeconomic History   Marital status: Married    Spouse name: Not on file   Number of children: Not on file   Years of education: Not on file   Highest education level: Not on file  Occupational History   Not on file  Tobacco Use   Smoking status: Some Days    Types: Cigars   Smokeless tobacco: Former   Tobacco comments:    good cigar every once in a while  Vaping Use   Vaping status: Never Used  Substance and Sexual Activity   Alcohol use: Yes    Alcohol/week: 2.0 standard drinks of alcohol    Types: 2 Cans of beer per week   Drug use: No   Sexual activity: Not on file  Other Topics Concern   Not on file  Social History Narrative   Not on file   Social Drivers of Health   Financial Resource Strain: Not on file  Food Insecurity: Not on file  Transportation Needs: Not on file  Physical Activity: Not on file  Stress: Not on file  Social Connections: Not on file    MEDICATIONS:  Current Outpatient Medications  Medication Sig Dispense Refill   fexofenadine (ALLEGRA ALLERGY) 180 MG tablet Take 1 tablet every day by oral route.     metFORMIN  (GLUCOPHAGE -XR) 500 MG 24 hr tablet Take 1 tablet (500 mg total) by mouth daily with breakfast. 90 tablet 2   omeprazole (PRILOSEC) 20 MG capsule Take 20 mg by mouth daily.     sitaGLIPtin  (JANUVIA ) 50 MG tablet Take 1 tablet (50 mg total) by mouth daily. 90 tablet 1   diazepam  (VALIUM ) 5 MG tablet Take 1  tablet (5 mg total) by mouth every 6 (six) hours as needed for muscle spasms. (Patient not taking: Reported on 11/28/2023) 30 tablet 0   gabapentin  (NEURONTIN ) 300 MG capsule Take 300 mg by mouth 3 (three) times daily as needed (pain). (Patient not taking: Reported on 11/28/2023)     methocarbamol  (ROBAXIN ) 500 MG tablet Take 1 tablet (500 mg total) by mouth every 6 (six) hours as needed for muscle spasms. (Patient not taking: Reported on 11/28/2023) 60 tablet 1   oxyCODONE -acetaminophen  (PERCOCET) 10-325 MG tablet Take 1 tablet by mouth every 4 (four) hours as needed for pain. (Patient not taking: Reported on 11/28/2023) 60 tablet 0   No current facility-administered medications for this visit.  PHYSICAL EXAM: Vitals:   02/27/24 0916  BP: 130/80  Pulse: 86  SpO2: 97%  Weight: 199 lb (90.3 kg)  Height: 5\' 11"  (1.803 m)    Body mass index is 27.75 kg/m.  Wt Readings from Last 3 Encounters:  02/27/24 199 lb (90.3 kg)  11/28/23 204 lb 1.6 oz (92.6 kg)  05/08/17 207 lb (93.9 kg)    General: Well developed, well nourished male in no apparent distress.  HEENT: AT/[place], no external lesions.  Eyes: Conjunctiva clear and no icterus. Neck: Neck supple  Lungs: Respirations not labored Neurologic: Alert, oriented, normal speech Extremities / Skin: Dry.  Psychiatric: Does not appear depressed or anxious  Diabetic Foot Exam - Simple   Simple Foot Form Diabetic Foot exam was performed with the following findings: Yes 02/27/2024  9:52 AM  Visual Inspection No deformities, no ulcerations, no other skin breakdown bilaterally: Yes Sensation Testing Intact to touch and monofilament testing bilaterally: Yes Pulse Check Posterior Tibialis and Dorsalis pulse intact bilaterally: Yes Comments     LABS Reviewed Lab Results  Component Value Date   HGBA1C 6.6 (A) 02/27/2024   HGBA1C 7.1 (A) 11/28/2023   HGBA1C 6.6 (H) 05/05/2017   No results found for: "FRUCTOSAMINE" Lab Results  Component  Value Date   CHOL 161 11/28/2023   HDL 55 11/28/2023   LDLCALC 86 11/28/2023   TRIG 104 11/28/2023   CHOLHDL 2.9 11/28/2023   No results found for: "MICRALBCREAT" Lab Results  Component Value Date   CREATININE 0.83 11/28/2023   No results found for: "GFR"  ASSESSMENT / PLAN  1. Type 2 diabetes mellitus with hyperglycemia, without long-term current use of insulin (HCC)     Diabetes Mellitus type 2, complicated by ?  Diabetic neuropathy. - Diabetic status / severity: Fair control.  Lab Results  Component Value Date   HGBA1C 6.6 (A) 02/27/2024    - Hemoglobin A1c goal : <6.5%   - Medications: See below.  No change.  I) continue metformin  500 mg extended release 1 tablet 1 time a day.  II) continue Januvia  50 mg daily.  - Home glucose testing: In the morning fasting.   - Discussed/ Gave Hypoglycemia treatment plan.  # Consult : Not required at this time.  Patient had seen dietitian, was referred in the initial consult visit.   # Annual urine for microalbuminuria/ creatinine ratio, no microalbuminuria currently. Last No results found for: "MICRALBCREAT"  Microalbumin creatinine ratio in October 2024 was 13.8, normal.  # Foot check nightly / neuropathy.  History of spinal surgery.  # Annual dilated diabetic eye exams.   - Diet: Make healthy diabetic food choices, discussed in detail about portion control and limiting carbohydrates breads, fruits etc. - Life style / activity / exercise: Discussed.  2. Blood pressure  -  BP Readings from Last 1 Encounters:  02/27/24 130/80    - Control is in target.  - No change in current plans.  3. Lipid status / Hyperlipidemia - Last  Lab Results  Component Value Date   LDLCALC 86 11/28/2023   LDL 81 in July 2024. -Currently not on any statin, will consider in the future visit to start low-dose of a statin.  Patient agreed.  Diagnoses and all orders for this visit:  Type 2 diabetes mellitus with hyperglycemia, without  long-term current use of insulin (HCC) -     POCT glycosylated hemoglobin (Hb A1C)   Patient generally complete laboratory test with PCP.  DISPOSITION Follow up in clinic  in 4 months suggested.   All questions answered and patient verbalized understanding of the plan.  [place] Iliya Spivack, MD [facility] [address] [place], [address] [place], [place] [address] Phone # [phone]  At least part of this note was generated using voice recognition software. Inadvertent word errors may have occurred, which were not recognized during the proofreading process.

## 2024-05-07 LAB — LAB REPORT - SCANNED
A1c: 7.4
Creatinine, POC: 48 mg/dL
EGFR: 93
Free T4: 1.15 ng/dL
Microalbumin, Urine: <0.3
PSA, Total: 0.53
TSH: 2.19 (ref 0.41–5.90)

## 2024-05-09 ENCOUNTER — Ambulatory Visit: Payer: Self-pay | Admitting: Endocrinology

## 2024-05-29 ENCOUNTER — Other Ambulatory Visit: Payer: Self-pay | Admitting: Endocrinology

## 2024-05-29 DIAGNOSIS — E1169 Type 2 diabetes mellitus with other specified complication: Secondary | ICD-10-CM

## 2024-05-29 NOTE — Telephone Encounter (Signed)
 Refill request complete

## 2024-07-01 ENCOUNTER — Ambulatory Visit: Payer: Self-pay | Admitting: Endocrinology

## 2024-07-01 ENCOUNTER — Encounter: Payer: Self-pay | Admitting: Endocrinology

## 2024-07-01 ENCOUNTER — Ambulatory Visit: Admitting: Endocrinology

## 2024-07-01 VITALS — BP 138/70 | HR 74 | Ht 71.0 in | Wt 201.4 lb

## 2024-07-01 DIAGNOSIS — E1169 Type 2 diabetes mellitus with other specified complication: Secondary | ICD-10-CM

## 2024-07-01 LAB — POCT GLYCOSYLATED HEMOGLOBIN (HGB A1C): Hemoglobin A1C: 7.6 % — AB (ref 4.0–5.6)

## 2024-07-01 MED ORDER — PIOGLITAZONE HCL 30 MG PO TABS: 30.0000 mg | ORAL_TABLET | Freq: Every day | ORAL | 3 refills | Status: AC

## 2024-07-01 MED ORDER — SITAGLIPTIN PHOSPHATE 100 MG PO TABS: 100.0000 mg | ORAL_TABLET | Freq: Every day | ORAL | 3 refills | Status: AC

## 2024-07-01 NOTE — Patient Instructions (Addendum)
 Latest Reference Range & Units 11/28/23 09:28 02/27/24 09:28 07/01/24 09:44  Hemoglobin A1C 4.0 - 5.6 % 7.1 ! Pend 6.6 ! Pend 7.6 !  !: Data is abnormal  Stop metformin   Increase Januvia  to 100 mg daily. Start actos  30 mg daily.

## 2024-07-01 NOTE — Progress Notes (Signed)
 Outpatient Endocrinology Note Gabriel Ryllie Nieland, MD   Patient's Name: Gabriel Cummings    DOB: [birth year]    MRN: [id number]                                                    REASON OF VISIT: Follow-up for type 2 diabetes mellitus  REFERRING PROVIDER: Erskine Neptune, FNP  PCP: Youse Morna FALCON, NP  HISTORY OF PRESENT ILLNESS:   Gabriel Cummings is a 73 y.o. old male with past medical history listed below, is here for follow-up for type 2 diabetes mellitus.   Pertinent Diabetes History: Patient was diagnosed with type 2 diabetes mellitus around 2010/2011 timeframe.  Patient had side effects with some antidiabetic medication.  Patient was referred to endocrinology for further evaluation and management of type 2 diabetes mellitus, initial consult in February 2025.  Previous diabetes education: no  Family h/o diabetes mellitus: Multiple family members with type 2 diabetes mellitus.  Hemoglobin A1c was 7.2% in October 2024.  Referral medical records reviewed.  Chronic Diabetes Complications : Retinopathy: no. Last ophthalmology exam was done on annually, following with ophthalmology regularly.  Nephropathy: no, on ACE/ARB / none Peripheral neuropathy: yes, history of spinal surgery in 2018.  No longer taking gabapentin . Coronary artery disease: no Stroke: no  Relevant comorbidities and cardiovascular risk factors: Obesity: no Body mass index is 28.09 kg/m.  Hypertension: Yes  Hyperlipidemia : Yes, not on statin  Patient refuses statin and ACE/ARB therapy.   Current / Home Diabetic regimen includes:  Metformin  ER 500 mg 1 tablet daily. Januvia  50 mg daily.  Prior diabetic medications: He had taken higher dose of metformin , Rybelsus and Jardiance in the past.  Diarrhea with higher dose of metformin . Rybelsus caused nausea.  Jardiance stopped due to not feeling good.  Glycemic data:   Glucose log reviewed, mostly hyperglycemia, he has been checking in the morning fasting  blood sugar are reviewed as : 196, 144, 158, 198, 182, 165, 219, 155, 165, 161, 139, 158, 213, 171, 181, 162, 165.  Hypoglycemia: Patient has no hypoglycemic episodes. Patient has hypoglycemia awareness.  Factors modifying glucose control: 1.  Diabetic diet assessment: 3 meals, cereals, corn, fruits, meats, breads.   2.  Staying active or exercising: gym, 2 x a week, active at farm.   3.  Medication compliance: compliant all of the time.  Interval history  Hemoglobin A1c worsened to 7.6%.  He is mostly having fasting hyperglycemia.  He reports he still having diarrhea.  Denies numbness and tingling of the feet.  No vision problem.  No other complaints today.  He denies eating regular bedtime snack occasionally eating fruit/apple.  REVIEW OF SYSTEMS As per history of present illness.   PAST MEDICAL HISTORY: Past Medical History:  Diagnosis Date   Arthritis    Diabetes mellitus without complication (HCC)    h/o elevated A1C, however do not take anything for it 05/05/17   GERD (gastroesophageal reflux disease)    History of hiatal hernia    PUD (peptic ulcer disease)    Reflux esophagitis    Scoliosis (and kyphoscoliosis), idiopathic     PAST SURGICAL HISTORY: Past Surgical History:  Procedure Laterality Date   ABDOMINAL EXPOSURE N/A 05/08/2017   Procedure: ABDOMINAL EXPOSURE;  Surgeon: Oris Krystal FALCON, MD;  Location: [facility] OR;  Service: Vascular;  Laterality: N/A;   ANTERIOR LAT LUMBAR FUSION Left 05/08/2017   Procedure: Left L3-4 L4-5 Anterolateral lumbar interbody fusion;  Surgeon: Unice Pac, MD;  Location: [facility] OR;  Service: Neurosurgery;  Laterality: Left;  Left L3-4 L4-5 Anterolateral lumbar interbody fusion   ANTERIOR LUMBAR FUSION N/A 05/08/2017   Procedure: L5-S1 Anterior lumbar interbody fusion with Dr. Krystal Early for approach;  Surgeon: Unice Pac, MD;  Location: [facility] OR;  Service: Neurosurgery;  Laterality: N/A;  L5-S1 Anterior lumbar interbody fusion with Dr. Krystal Early for  approach   CHOLECYSTECTOMY     COLONOSCOPY WITH PROPOFOL  N/A 03/13/2017   Procedure: COLONOSCOPY WITH PROPOFOL ;  Surgeon: Viktoria Lamar DASEN, MD;  Location: [facility] ENDOSCOPY;  Service: Endoscopy;  Laterality: N/A;   HERNIA REPAIR  05/2014   LUMBAR PERCUTANEOUS PEDICLE SCREW 3 LEVEL N/A 05/08/2017   Procedure: L3-S1 Percutaneous pedicle screws;  Surgeon: Unice Pac, MD;  Location: [facility] OR;  Service: Neurosurgery;  Laterality: N/A;  L3-S1 Percutaneous pedicle screws   MENISCUS REPAIR Left 1987   ROTATOR CUFF REPAIR      ALLERGIES: No Known Allergies  FAMILY HISTORY:  History reviewed. No pertinent family history.  SOCIAL HISTORY: Social History   Socioeconomic History   Marital status: Married    Spouse name: Not on file   Number of children: Not on file   Years of education: Not on file   Highest education level: Not on file  Occupational History   Not on file  Tobacco Use   Smoking status: Some Days    Types: Cigars   Smokeless tobacco: Former   Tobacco comments:    good cigar every once in a while  Vaping Use   Vaping status: Never Used  Substance and Sexual Activity   Alcohol use: Yes    Alcohol/week: 2.0 standard drinks of alcohol    Types: 2 Cans of beer per week   Drug use: No   Sexual activity: Not on file  Other Topics Concern   Not on file  Social History Narrative   Not on file   Social Drivers of Health   Financial Resource Strain: Not on file  Food Insecurity: Not on file  Transportation Needs: Not on file  Physical Activity: Not on file  Stress: Not on file  Social Connections: Not on file    MEDICATIONS:  Current Outpatient Medications  Medication Sig Dispense Refill   fexofenadine (ALLEGRA ALLERGY) 180 MG tablet Take 1 tablet every day by oral route.     omeprazole (PRILOSEC) 20 MG capsule Take 20 mg by mouth daily.     pioglitazone  (ACTOS ) 30 MG tablet Take 1 tablet (30 mg total) by mouth daily. 90 tablet 3   sitaGLIPtin  (JANUVIA ) 100 MG tablet  Take 1 tablet (100 mg total) by mouth daily. 90 tablet 3   No current facility-administered medications for this visit.    PHYSICAL EXAM: Vitals:   07/01/24 0926  BP: 138/70  Pulse: 74  SpO2: 99%  Weight: 201 lb 6.4 oz (91.4 kg)  Height: 5' 11 (1.803 m)    Body mass index is 28.09 kg/m.  Wt Readings from Last 3 Encounters:  07/01/24 201 lb 6.4 oz (91.4 kg)  02/27/24 199 lb (90.3 kg)  11/28/23 204 lb 1.6 oz (92.6 kg)    General: Well developed, well nourished male in no apparent distress.  HEENT: AT/[place], no external lesions.  Eyes: Conjunctiva clear and no icterus. Neck: Neck supple  Lungs: Respirations not labored Neurologic: Alert, oriented, normal  speech Extremities / Skin: Dry.  Psychiatric: Does not appear depressed or anxious  Diabetic Foot Exam - Simple   Simple Foot Form Visual Inspection See comments: Yes Sensation Testing See comments: Yes Pulse Check Posterior Tibialis and Dorsalis pulse intact bilaterally: Yes Comments Monofilament diminished bilaterally. Mild hammer toes deformity.     LABS Reviewed Lab Results  Component Value Date   HGBA1C 7.6 (A) 07/01/2024   HGBA1C 6.6 (A) 02/27/2024   HGBA1C 7.1 (A) 11/28/2023   No results found for: FRUCTOSAMINE Lab Results  Component Value Date   CHOL 161 11/28/2023   HDL 55 11/28/2023   LDLCALC 86 11/28/2023   TRIG 104 11/28/2023   CHOLHDL 2.9 11/28/2023   Lab Results  Component Value Date   MICRALBCREAT  05/07/2024     Comment:     Unable to calculate   Lab Results  Component Value Date   CREATININE 0.83 11/28/2023   No results found for: GFR  ASSESSMENT / PLAN  1. Type 2 diabetes mellitus with other specified complication, without long-term current use of insulin (HCC)     Diabetes Mellitus type 2, complicated by ?  Diabetic neuropathy. - Diabetic status / severity: Fair control.  Lab Results  Component Value Date   HGBA1C 7.6 (A) 07/01/2024    - Hemoglobin A1c goal :  <6.5%   - Medications: See below.  Change as follows.  I) stop metformin  500 mg extended release 1 tablet 1 time a day.  Due to diarrhea. II) increase Januvia  from 50 mg to 100 mg daily. III) start pioglitazone /Actos  30 mg daily.  Discussed about potential side effect including pedal edema.  Asked to call our clinic if he develops any pedal edema.  - Home glucose testing: In the morning fasting.    - Discussed/ Gave Hypoglycemia treatment plan.  # Consult : Not required at this time.  # Annual urine for microalbuminuria/ creatinine ratio, no microalbuminuria currently. Last  Lab Results  Component Value Date   MICRALBCREAT  05/07/2024     Comment:     Unable to calculate    # Foot check nightly / neuropathy.  History of spinal surgery.  # Annual dilated diabetic eye exams.   - Diet: Make healthy diabetic food choices, discussed in detail about portion control and limiting carbohydrates breads, fruits etc. Discussed about limiting portion of fluids. - Life style / activity / exercise: Discussed.  2. Blood pressure  -  BP Readings from Last 1 Encounters:  07/01/24 138/70    - Control is in target.  - No change in current plans.  3. Lipid status / Hyperlipidemia - Last  Lab Results  Component Value Date   LDLCALC 86 11/28/2023   LDL 81 in July 2024. -Currently not on any statin, will consider in the future visit to start low-dose of a statin.  Patient agreed.  Diagnoses and all orders for this visit:  Type 2 diabetes mellitus with other specified complication, without long-term current use of insulin (HCC) -     POCT glycosylated hemoglobin (Hb A1C) -     sitaGLIPtin  (JANUVIA ) 100 MG tablet; Take 1 tablet (100 mg total) by mouth daily. -     pioglitazone  (ACTOS ) 30 MG tablet; Take 1 tablet (30 mg total) by mouth daily.   Patient generally complete laboratory test with PCP.  DISPOSITION Follow up in clinic in 3 months suggested.   All questions answered and  patient verbalized understanding of the plan.  Gabriel Jeanny Rymer, MD  [facility] [address] [place], [address] [place], [place] [address] Phone # [phone]  At least part of this note was generated using voice recognition software. Inadvertent word errors may have occurred, which were not recognized during the proofreading process.

## 2024-09-17 ENCOUNTER — Telehealth: Admitting: Endocrinology

## 2024-09-17 ENCOUNTER — Encounter: Payer: Self-pay | Admitting: Endocrinology

## 2024-09-17 DIAGNOSIS — Z7984 Long term (current) use of oral hypoglycemic drugs: Secondary | ICD-10-CM

## 2024-09-17 DIAGNOSIS — E1169 Type 2 diabetes mellitus with other specified complication: Secondary | ICD-10-CM

## 2024-09-17 DIAGNOSIS — E162 Hypoglycemia, unspecified: Secondary | ICD-10-CM | POA: Diagnosis not present

## 2024-09-17 NOTE — Progress Notes (Signed)
 Outpatient Endocrinology Note Gabriel Oliveria, MD   Patient's Name: Gabriel Cummings    DOB: 04/24/1951    MRN: [id number]  I connected with  Gabriel Cummings on 09/17/24 by a video enabled telemedicine application and verified that I am speaking with the correct person using two identifiers.   I discussed the limitations of evaluation and management by telemedicine. The patient expressed understanding and agreed to proceed.   Provider location : Office Patient location : At home  MyChart virtual visit due to weather.                                                    REASON OF VISIT: Follow-up for type 2 diabetes mellitus  REFERRING PROVIDER: Erskine Neptune, FNP  PCP: Gabriel Morna FALCON, NP  HISTORY OF PRESENT ILLNESS:   Gabriel Cummings is a 73 y.o. old male with past medical history listed below, is here for follow-up for type 2 diabetes mellitus.   Pertinent Diabetes History: Patient was diagnosed with type 2 diabetes mellitus around 2010/2011 timeframe.  Patient had side effects with some antidiabetic medication.  Patient was referred to endocrinology for further evaluation and management of type 2 diabetes mellitus, initial consult in February 2025.  Previous diabetes education: no  Family h/o diabetes mellitus: Multiple family members with type 2 diabetes mellitus.  Hemoglobin A1c was 7.2% in October 2024.  Referral medical records reviewed.  Chronic Diabetes Complications : Retinopathy: no. Last ophthalmology exam was done on annually, following with ophthalmology regularly.  Nephropathy: no, on ACE/ARB / none Peripheral neuropathy: yes, history of spinal surgery in 2018.  No longer taking gabapentin . Coronary artery disease: no Stroke: no  Relevant comorbidities and cardiovascular risk factors: Obesity: no There is no height or weight on file to calculate BMI.  Hypertension: Yes  Hyperlipidemia : Yes, not on statin  Patient refuses statin and ACE/ARB  therapy.  Current / Home Diabetic regimen includes: Januvia  100 mg daily. Pioglitazone  30 mg daily  Prior diabetic medications: He had taken higher dose of metformin , Rybelsus and Jardiance in the past.  Diarrhea with higher dose of metformin . Rybelsus caused nausea.  Jardiance stopped due to not feeling good.  Metformin  is stopped due to diarrhea even with 1 tablet of 500 mg. Pioglitazone  is started in September 2025.  Glycemic data:   He reports blood sugar has been 115-145 range.  Hypoglycemia: Patient has no hypoglycemic episodes. Patient has hypoglycemia awareness.  Factors modifying glucose control: 1.  Diabetic diet assessment: 3 meals, cereals, corn, fruits, meats, breads.   2.  Staying active or exercising: gym, 2 x a week, active at farm.   3.  Medication compliance: compliant all of the time.  Interval history  He has been taking Januvia  and pioglitazone .  He denies any new symptoms.  No pedal edema.  He reports he has been feeling better on current regimen.  Diarrhea stopped after not taking metformin .  No vision problem.  No numbness and tingling to feet.  No other complaints today.  Asked patient to complete lab for hemoglobin A1c in few days, he wants to check at the time of next visit.  Today is MyChart virtual visit due to weather.  REVIEW OF SYSTEMS As per history of present illness.   PAST MEDICAL HISTORY: Past Medical History:  Diagnosis Date  Arthritis    Diabetes mellitus without complication (HCC)    h/o elevated A1C, however do not take anything for it 05/05/17   GERD (gastroesophageal reflux disease)    History of hiatal hernia    PUD (peptic ulcer disease)    Reflux esophagitis    Scoliosis (and kyphoscoliosis), idiopathic     PAST SURGICAL HISTORY: Past Surgical History:  Procedure Laterality Date   ABDOMINAL EXPOSURE N/A 05/08/2017   Procedure: ABDOMINAL EXPOSURE;  Surgeon: Oris Krystal FALCON, MD;  Location: [facility] OR;  Service: Vascular;   Laterality: N/A;   ANTERIOR LAT LUMBAR FUSION Left 05/08/2017   Procedure: Left L3-4 L4-5 Anterolateral lumbar interbody fusion;  Surgeon: Unice Pac, MD;  Location[facility] OR;  Service: Neurosurgery;  Laterality: Left;  Left L3-4 L4-5 Anterolateral lumbar interbody fusion   ANTERIOR LUMBAR FUSION N/A 05/08/2017   Procedure: L5-S1 Anterior lumbar interbody fusion with Dr. Krystal Early for approach;  Surgeon: Unice Pac, MD;  Location: [facility] OR;  Service: Neurosurgery;  Laterality: N/A;  L5-S1 Anterior lumbar interbody fusion with Dr. Krystal Early for approach   CHOLECYSTECTOMY     COLONOSCOPY WITH PROPOFOL  N/A 03/13/2017   Procedure: COLONOSCOPY WITH PROPOFOL ;  Surgeon: Viktoria Lamar DASEN, MD;  Location: [facility] ENDOSCOPY;  Service: Endoscopy;  Laterality: N/A;   HERNIA REPAIR  05/2014   LUMBAR PERCUTANEOUS PEDICLE SCREW 3 LEVEL N/A 05/08/2017   Procedure: L3-S1 Percutaneous pedicle screws;  Surgeon: Unice Pac, MD;  Location: [facility] OR;  Service: Neurosurgery;  Laterality: N/A;  L3-S1 Percutaneous pedicle screws   MENISCUS REPAIR Left 1987   ROTATOR CUFF REPAIR      ALLERGIES: No Known Allergies  FAMILY HISTORY:  History reviewed. No pertinent family history.  SOCIAL HISTORY: Social History   Socioeconomic History   Marital status: Married    Spouse name: Not on file   Number of children: Not on file   Years of education: Not on file   Highest education level: Not on file  Occupational History   Not on file  Tobacco Use   Smoking status: Some Days    Types: Cigars   Smokeless tobacco: Former   Tobacco comments:    good cigar every once in a while  Vaping Use   Vaping status: Never Used  Substance and Sexual Activity   Alcohol use: Yes    Alcohol/week: 2.0 standard drinks of alcohol    Types: 2 Cans of beer per week   Drug use: No   Sexual activity: Not on file  Other Topics Concern   Not on file  Social History Narrative   Not on file   Social Drivers of Health   Financial  Resource Strain: Not on file  Food Insecurity: Not on file  Transportation Needs: Not on file  Physical Activity: Not on file  Stress: Not on file  Social Connections: Not on file    MEDICATIONS:  Current Outpatient Medications  Medication Sig Dispense Refill   fexofenadine (ALLEGRA ALLERGY) 180 MG tablet Take 1 tablet every day by oral route.     omeprazole (PRILOSEC) 20 MG capsule Take 20 mg by mouth daily.     pioglitazone  (ACTOS ) 30 MG tablet Take 1 tablet (30 mg total) by mouth daily. 90 tablet 3   sitaGLIPtin  (JANUVIA ) 100 MG tablet Take 1 tablet (100 mg total) by mouth daily. 90 tablet 3   No current facility-administered medications for this visit.    PHYSICAL EXAM: There were no vitals filed for this visit.  There is no height or weight on file to calculate BMI.  Wt Readings from Last 3 Encounters:  07/01/24 201 lb 6.4 oz (91.4 kg)  02/27/24 199 lb (90.3 kg)  11/28/23 204 lb 1.6 oz (92.6 kg)    General: Well developed, well nourished male in no apparent distress.  HEENT: AT/[place], no external lesions.  Eyes: Conjunctiva clear and no icterus. Neurologic: Alert, oriented, normal speech Extremities / Skin: Dry.  Psychiatric: Does not appear depressed or anxious  Diabetic Foot Exam - Simple   No data filed     LABS Reviewed Lab Results  Component Value Date   HGBA1C 7.6 (A) 07/01/2024   HGBA1C 6.6 (A) 02/27/2024   HGBA1C 7.1 (A) 11/28/2023   No results found for: FRUCTOSAMINE Lab Results  Component Value Date   CHOL 161 11/28/2023   HDL 55 11/28/2023   LDLCALC 86 11/28/2023   TRIG 104 11/28/2023   CHOLHDL 2.9 11/28/2023   Lab Results  Component Value Date   MICRALBCREAT  05/07/2024     Comment:     Unable to calculate   Lab Results  Component Value Date   CREATININE 0.83 11/28/2023   No results found for: GFR  ASSESSMENT / PLAN  1. Type 2 diabetes mellitus with other specified complication, without long-term current use of insulin (HCC)     Diabetes Mellitus type 2, complicated by ?  Diabetic neuropathy. - Diabetic status / severity: Fair control.  Lab Results  Component Value Date   HGBA1C 7.6 (A) 07/01/2024    - Hemoglobin A1c goal : <6.5%   - Medications: See below.    I) continue Januvia  100 mg daily. III) continue pioglitazone /Actos  30 mg daily.    - Home glucose testing: In the morning fasting.    - Discussed/ Gave Hypoglycemia treatment plan.  # Consult : Not required at this time.  # Annual urine for microalbuminuria/ creatinine ratio, no microalbuminuria currently. Last  Lab Results  Component Value Date   MICRALBCREAT  05/07/2024     Comment:     Unable to calculate    # Foot check nightly / neuropathy.  History of spinal surgery.  # Annual dilated diabetic eye exams.   - Diet: Make healthy diabetic food choices, discussed in detail about portion control and limiting carbohydrates breads, fruits etc. Discussed about limiting portion of fluids. - Life style / activity / exercise: Discussed.  2. Blood pressure  -  BP Readings from Last 1 Encounters:  07/01/24 138/70    - Control is in target.  - No change in current plans.  3. Lipid status / Hyperlipidemia - Last  Lab Results  Component Value Date   LDLCALC 86 11/28/2023   LDL 81 in July 2024. -Currently not on any statin, will consider in the future visit to start low-dose of a statin.  Patient agreed.  Diagnoses and all orders for this visit:  Type 2 diabetes mellitus with other specified complication, without long-term current use of insulin [facility])    Patient generally complete laboratory test with PCP.  DISPOSITION Follow up in clinic in 3 months suggested.   All questions answered and patient verbalized understanding of the plan.  Yaxiel Minnie, MD [facility] Endocrinology [facility] [address] [place], [address] [place], [place] [address] Phone # [phone]  At least part of this note was generated using  voice recognition software. Inadvertent word errors may have occurred, which were not recognized during the proofreading process.
